# Patient Record
Sex: Female | Born: 1975 | Race: White | Hispanic: No | Marital: Married | Smoking: Never smoker
Health system: Southern US, Community
[De-identification: ages and names within clinical notes are randomized; demographics above are authoritative.]

## PROBLEM LIST (undated history)

## (undated) DIAGNOSIS — N809 Endometriosis, unspecified: Secondary | ICD-10-CM

## (undated) DIAGNOSIS — D259 Leiomyoma of uterus, unspecified: Secondary | ICD-10-CM

## (undated) DIAGNOSIS — F419 Anxiety disorder, unspecified: Secondary | ICD-10-CM

## (undated) DIAGNOSIS — IMO0002 Reserved for concepts with insufficient information to code with codable children: Secondary | ICD-10-CM

## (undated) DIAGNOSIS — J302 Other seasonal allergic rhinitis: Secondary | ICD-10-CM

## (undated) DIAGNOSIS — T7840XA Allergy, unspecified, initial encounter: Secondary | ICD-10-CM

## (undated) HISTORY — DX: Other seasonal allergic rhinitis: J30.2

## (undated) HISTORY — PX: LAPAROSCOPIC ABDOMINAL EXPLORATION: SHX6249

## (undated) HISTORY — DX: Endometriosis, unspecified: N80.9

## (undated) HISTORY — PX: WISDOM TOOTH EXTRACTION: SHX21

## (undated) HISTORY — DX: Reserved for concepts with insufficient information to code with codable children: IMO0002

## (undated) HISTORY — PX: COLPOSCOPY: SHX161

## (undated) HISTORY — DX: Allergy, unspecified, initial encounter: T78.40XA

## (undated) HISTORY — DX: Anxiety disorder, unspecified: F41.9

---
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
## 1999-05-25 ENCOUNTER — Other Ambulatory Visit: Admission: RE | Admit: 1999-05-25 | Discharge: 1999-05-25 | Payer: Self-pay | Admitting: Obstetrics and Gynecology

## 2001-08-25 ENCOUNTER — Ambulatory Visit (HOSPITAL_COMMUNITY): Admission: RE | Admit: 2001-08-25 | Discharge: 2001-08-25 | Payer: Self-pay | Admitting: Obstetrics and Gynecology

## 2001-08-25 ENCOUNTER — Encounter: Payer: Self-pay | Admitting: Obstetrics and Gynecology

## 2001-10-12 ENCOUNTER — Observation Stay (HOSPITAL_COMMUNITY): Admission: RE | Admit: 2001-10-12 | Discharge: 2001-10-13 | Payer: Self-pay | Admitting: Obstetrics and Gynecology

## 2001-12-07 ENCOUNTER — Ambulatory Visit (HOSPITAL_COMMUNITY): Admission: RE | Admit: 2001-12-07 | Discharge: 2001-12-07 | Payer: Self-pay | Admitting: Obstetrics and Gynecology

## 2001-12-07 ENCOUNTER — Encounter: Payer: Self-pay | Admitting: Obstetrics and Gynecology

## 2002-08-29 ENCOUNTER — Other Ambulatory Visit: Admission: RE | Admit: 2002-08-29 | Discharge: 2002-08-29 | Payer: Self-pay | Admitting: Obstetrics and Gynecology

## 2003-09-02 ENCOUNTER — Other Ambulatory Visit: Admission: RE | Admit: 2003-09-02 | Discharge: 2003-09-02 | Payer: Self-pay | Admitting: Obstetrics and Gynecology

## 2004-09-17 ENCOUNTER — Other Ambulatory Visit: Admission: RE | Admit: 2004-09-17 | Discharge: 2004-09-17 | Payer: Self-pay | Admitting: Obstetrics and Gynecology

## 2005-09-09 ENCOUNTER — Other Ambulatory Visit: Admission: RE | Admit: 2005-09-09 | Discharge: 2005-09-09 | Payer: Self-pay | Admitting: Obstetrics and Gynecology

## 2007-04-05 ENCOUNTER — Ambulatory Visit (HOSPITAL_COMMUNITY): Admission: RE | Admit: 2007-04-05 | Discharge: 2007-04-05 | Payer: Self-pay | Admitting: Obstetrics and Gynecology

## 2007-11-30 ENCOUNTER — Ambulatory Visit (HOSPITAL_COMMUNITY): Admission: RE | Admit: 2007-11-30 | Discharge: 2007-11-30 | Payer: Self-pay | Admitting: Obstetrics and Gynecology

## 2009-04-08 IMAGING — RF DG HYSTEROGRAM
5 series · 5 of 5 positions shown · non-contrast
Comparison: none

CLINICAL DATA: Infertility. Evaluate tubal patency.
HYSTEROSALPINGOGRAM:

[Series 1: run · 1 of 1 slices shown (1 of 5)]
[im 1/1]
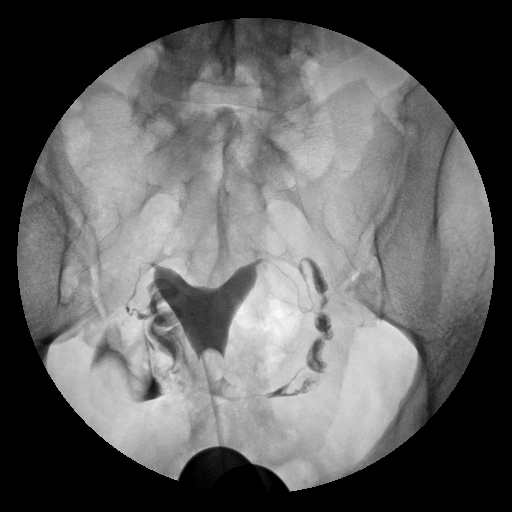

[Series 2: run · 1 of 1 slices shown (2 of 5)]
[im 1/1]
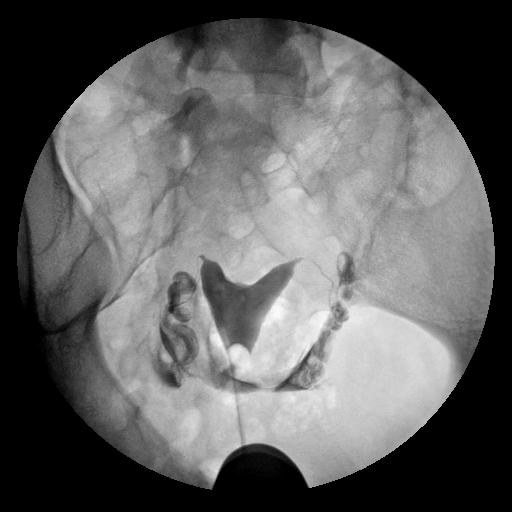

[Series 3: run · 1 of 1 slices shown (3 of 5)]
[im 1/1]
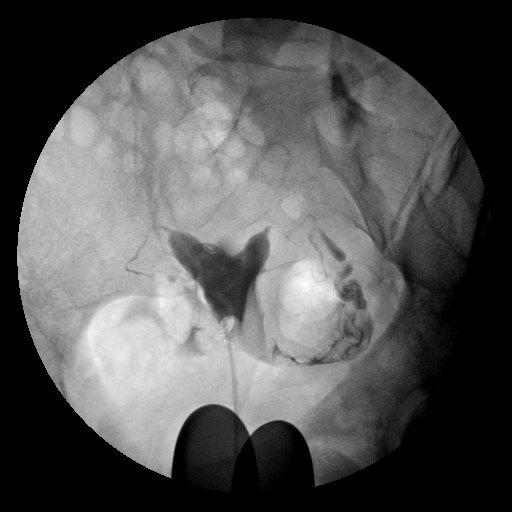

[Series 4: run · 1 of 1 slices shown (4 of 5)]
[im 1/1]
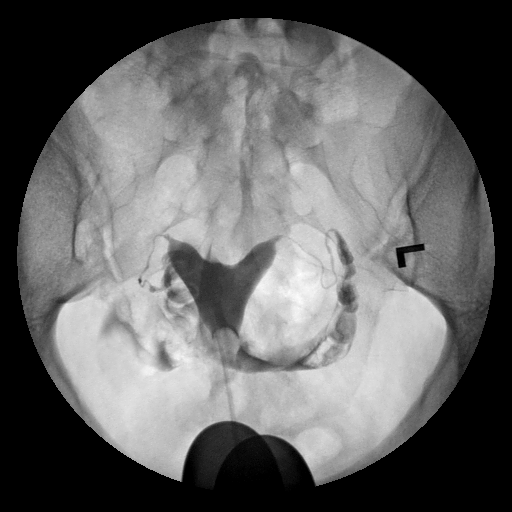

[Series 5: run · 1 of 1 slices shown (5 of 5)]
[im 1/1]
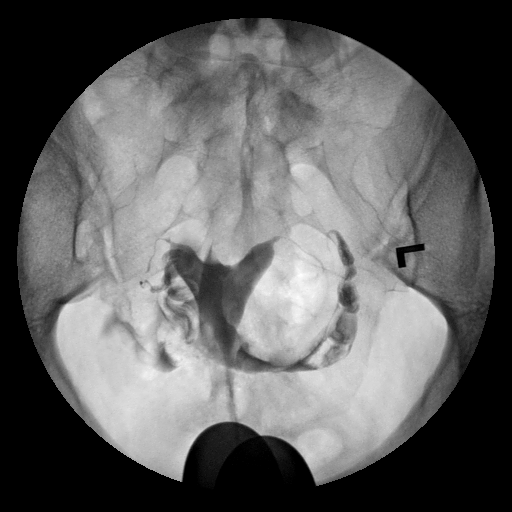

[5 of 5 positions shown; findings below may reference images not displayed]

FINDINGS: Following sterile cleansing of the external cervical os with Betadine, hysterosalpingography was performed via placement of a 5-French hysterosalpingography catheter into the lower uterine segment where it was stabilized with an end-catheter balloon.  Approximately 10 cc of Omnipaque 300% was injected into the endometrial canal without difficulty.   The endometrial morphology is notable for a dip in the fundal contour. This measures less than 1 cm in depth between the line drawn between the cornua and the mid portion of the fundal endometrium suggesting that this is an arcuate morphology rather than signs of a shallow septum.  If confirmation is desired, pelvic ultrasound with 3-D reconstruction or MRI can be undertaken to confirm this impression. No evidence of intraluminal filling defects are otherwise seen.
The fallopian tubes have a normal morphology and bilateral free intraperitoneal spill was demonstrated.   No evidence for loculation of contrast is seen within the pelvis to suggest the presence of peritubal or periovarian adhesions. 
At the conclusion of the exam, the end-catheter balloon was lowered for evaluation of the lower uterine segment, which appeared normal.
IMPRESSION: Uterine endometrial morphology suggesting an arcuate uterus.  Please see above report for more detailed discussion. Bilateral tubal patency with free flow confirmed.

## 2009-08-06 ENCOUNTER — Inpatient Hospital Stay (HOSPITAL_COMMUNITY): Admission: AD | Admit: 2009-08-06 | Discharge: 2009-08-06 | Payer: Self-pay | Admitting: Obstetrics and Gynecology

## 2009-11-06 ENCOUNTER — Inpatient Hospital Stay (HOSPITAL_COMMUNITY): Admission: AD | Admit: 2009-11-06 | Discharge: 2009-11-09 | Payer: Self-pay | Admitting: Family Medicine

## 2009-11-07 ENCOUNTER — Encounter (INDEPENDENT_AMBULATORY_CARE_PROVIDER_SITE_OTHER): Payer: Self-pay | Admitting: Obstetrics and Gynecology

## 2009-11-10 ENCOUNTER — Encounter: Admission: RE | Admit: 2009-11-10 | Discharge: 2009-11-20 | Payer: Self-pay | Admitting: Obstetrics and Gynecology

## 2009-12-11 ENCOUNTER — Encounter: Admission: RE | Admit: 2009-12-11 | Discharge: 2010-01-10 | Payer: Self-pay | Admitting: Obstetrics and Gynecology

## 2010-01-11 ENCOUNTER — Encounter: Admission: RE | Admit: 2010-01-11 | Discharge: 2010-02-10 | Payer: Self-pay | Admitting: Obstetrics and Gynecology

## 2010-03-11 ENCOUNTER — Encounter: Admission: RE | Admit: 2010-03-11 | Discharge: 2010-04-10 | Payer: Self-pay | Admitting: Obstetrics and Gynecology

## 2010-04-11 ENCOUNTER — Encounter: Admission: RE | Admit: 2010-04-11 | Discharge: 2010-05-11 | Payer: Self-pay | Admitting: Obstetrics and Gynecology

## 2010-05-12 ENCOUNTER — Encounter
Admission: RE | Admit: 2010-05-12 | Discharge: 2010-06-11 | Payer: Self-pay | Source: Home / Self Care | Admitting: Obstetrics and Gynecology

## 2010-06-12 ENCOUNTER — Encounter: Admission: RE | Admit: 2010-06-12 | Discharge: 2010-07-12 | Payer: Self-pay | Admitting: Obstetrics and Gynecology

## 2010-07-13 ENCOUNTER — Encounter: Admission: RE | Admit: 2010-07-13 | Discharge: 2010-07-17 | Payer: Self-pay | Admitting: Obstetrics and Gynecology

## 2010-11-22 HISTORY — PX: CERVICAL FUSION: SHX112

## 2011-02-22 LAB — CBC
HCT: 39.1 % (ref 36.0–46.0)
Hemoglobin: 10.6 g/dL — ABNORMAL LOW (ref 12.0–15.0)
Hemoglobin: 13.6 g/dL (ref 12.0–15.0)
MCHC: 34.7 g/dL (ref 30.0–36.0)
MCV: 87.8 fL (ref 78.0–100.0)
Platelets: 210 10*3/uL (ref 150–400)
RBC: 3.44 MIL/uL — ABNORMAL LOW (ref 3.87–5.11)
RBC: 4.46 MIL/uL (ref 3.87–5.11)
RDW: 13.1 % (ref 11.5–15.5)
WBC: 20 10*3/uL — ABNORMAL HIGH (ref 4.0–10.5)
WBC: 22.9 10*3/uL — ABNORMAL HIGH (ref 4.0–10.5)

## 2011-02-22 LAB — RH IMMUNE GLOB WKUP(>/=20WKS)(NOT WOMEN'S HOSP): Fetal Screen: NEGATIVE

## 2011-02-22 LAB — RPR: RPR Ser Ql: NONREACTIVE

## 2011-02-26 LAB — RH IMMUNE GLOBULIN WORKUP (NOT WOMEN'S HOSP)

## 2011-03-10 ENCOUNTER — Ambulatory Visit (HOSPITAL_COMMUNITY)
Admission: RE | Admit: 2011-03-10 | Discharge: 2011-03-10 | Disposition: A | Discharge status: Home / Self Care | Payer: Commercial Indemnity | Source: Ambulatory Visit | Attending: Internal Medicine | Admitting: Internal Medicine

## 2011-03-10 ENCOUNTER — Other Ambulatory Visit (HOSPITAL_COMMUNITY): Payer: Self-pay | Admitting: Internal Medicine

## 2011-03-10 DIAGNOSIS — R52 Pain, unspecified: Secondary | ICD-10-CM

## 2011-03-10 DIAGNOSIS — M542 Cervicalgia: Secondary | ICD-10-CM | POA: Insufficient documentation

## 2011-03-10 DIAGNOSIS — M5030 Other cervical disc degeneration, unspecified cervical region: Secondary | ICD-10-CM | POA: Insufficient documentation

## 2011-03-10 DIAGNOSIS — R209 Unspecified disturbances of skin sensation: Secondary | ICD-10-CM | POA: Insufficient documentation

## 2011-04-09 NOTE — H&P (Signed)
NAME:  Paula Garcia, Paula Garcia               ACCOUNT NO.:  [id number]   MEDICAL RECORD NO.:  [id number]          PATIENT TYPE:  AMB   LOCATION:  [facility]                           FACILITY:  [facility]   PHYSICIAN:  Zenaida Niece, M.D.DATE OF BIRTH:  18-Jul-1976   DATE OF ADMISSION:  04/05/2007  DATE OF DISCHARGE:                              HISTORY & PHYSICAL   CHIEF COMPLAINT:  Pelvic pain.   HISTORY OF PRESENT ILLNESS:  This is a 35 year old white female para 0  who has a long history of pelvic pain and endometriosis. She had a  laparoscopy done in November of 2002 which revealed endometriosis with  adhesions of the ovaries on both sides. After that surgery, she was  initially put on Ortho-Cyclen birth control pills. She was then switched  to Alesse birth control pills and her pain remained fairly stable with  some dyspareunia.  She had increasing pain again in 2004 and we  discussed options.  Her insurance would not cover Lupron so she was  continued on birth control pills.  She remained fairly stable with  occasional pelvic pain. At her annual exam in October, 2007 she had  stopped her birth control pill to possibly conceive.  She was having  painful periods as well as occasional pelvic pain.  Her pelvic pain has  increased to where she is requiring Ultracet for her periods. Due to the  fact that she wishes to conceive, all options were discussed and she  wants to proceed with another laparoscopy with possible fulguration of  endometriosis.   PAST MEDICAL HISTORY:  Is negative.   PAST SURGICAL HISTORY:  Significant only for the laparoscopy with  fulguration of endometriosis and lysis of adhesions.   ALLERGIES:  No known drug allergies.   CURRENT MEDICATIONS:  1. Diflucan 150 mg p.r.n.  2. Ultracet p.r.n. pain.   GYN HISTORY:  She does have a history of normal Pap smears and in 2006  her Pap smear was normal and positive for high risk HPV.   SOCIAL HISTORY:  She is married and denies  alcohol, tobacco or drug use.   FAMILY HISTORY:  Maternal grandmother possibly with breast cancer.   REVIEW OF SYSTEMS:  She has normal bowel and bladder function.   PHYSICAL EXAMINATION:  GENERAL:  This is a well-developed white female  in no acute distress. Weight is 165 pounds.  VITAL SIGNS:  She is afebrile with stable vital signs.  NECK:  Supple without lymphadenopathy or thyromegaly.  LUNGS:  Clear to auscultation.  HEART:  Regular rate and rhythm without murmur.  ABDOMEN:  Soft, nontender, nondistended without palpable masses.  EXTREMITIES:  Have no edema and are nontender.  PELVIC EXAM:  External genitalia has no lesions. On speculum exam the  cervix is normal. On bimanual exam she has a small mid planar nontender  uterus and mild adnexal tenderness without masses.   ASSESSMENT:  Recurrent pelvic pain with history of endometriosis.  The  patient does wish to conceive so treatment options have been discussed  with her. Her treatment options are limited due to the fact  that she  wants to conceive. She does want to proceed with laparoscopy and  possible fulguration of endometriosis.  All risks of surgery have been  discussed and she understands.   PLAN:  The plan is to admit the patient on the day of surgery for  diagnostic laparoscopy with possible fulguration of endometriosis or  lysis of adhesions.      Zenaida Niece, M.D.  Electronically Signed     TDM/MEDQ  D:  04/04/2007  T:  04/04/2007  Job:  [id number]

## 2011-04-09 NOTE — Op Note (Signed)
NAME:  Paula Garcia, Paula Garcia               ACCOUNT NO.:  [id number]   MEDICAL RECORD NO.:  [id number]          PATIENT TYPE:  AMB   LOCATION:  [facility]                           FACILITY:  [facility]   PHYSICIAN:  Zenaida Niece, M.D.DATE OF BIRTH:  [birth year]   DATE OF PROCEDURE:  04/05/2007  DATE OF DISCHARGE:                               OPERATIVE REPORT   PREOPERATIVE DIAGNOSIS:  Pelvic pain and history of endometriosis.   POSTOPERATIVE DIAGNOSIS:  Pelvic pain and endometriosis.   PROCEDURES:  Laparoscopy with fulguration of endometriosis.   SURGEON:  Zenaida Niece, M.D.   ANESTHESIA:  General endotracheal tube.   SPECIMENS:  None.   ESTIMATED BLOOD LOSS:  Minimal.   COMPLICATIONS:  None.   FINDINGS:  She had a normal appendix, liver edge, and gallbladder and  upper abdomen.  The right tube and ovary appeared normal.  There was a  small adhesion of the ovary to the right pelvic sidewall.  Both  uterosacral ligaments and the posterior cul-de-sac had moderate  endometriosis.  The left tube and ovary were essentially normal with a  small amount of endometriosis towards the distal end of the left  fallopian tube.  A small amount of endometriosis in the left ovarian  fossa.  There was also a small spot of endometriosis in the left  anterior cul-de-sac and the uterus was slightly enlarged with several  small fibroids.   PROCEDURE IN DETAIL:  The patient was taken to the operating room and  placed in the dorsosupine position.  General anesthesia was induced and  she was placed in mobile stirrups.  Abdomen was then prepped and draped  in the usual sterile fashion, bladder drained with a latex free  catheter, Hulka tenaculum applied to the cervix for uterine  manipulation.  Infraumbilical skin was infiltrated with quarter percent  Marcaine and a three-quarter centimeter horizontal incision was made in  her previous scar.  The Veress needle was inserted into the peritoneal  cavity and  placement confirmed by the water drop test and opening  pressure 6 mmHg.  CO2 gas was insufflated to a pressure of 14 mmHg and  the Veress needle was removed.  A 5 mm trocar was then introduced with  direct visualization with the laparoscope.  An additional 5 mm port was  then placed on the left side also under direct visualization.  Inspection revealed the above-mentioned findings.  The small adhesion on  the right ovary to the right pelvic sidewall was taken down bluntly and  bleeding from this area was controlled with bipolar cautery.  All  visible areas of endometriosis were fulgurated with bipolar cautery.  The ureters appeared to be lateral to any areas of fulguration.  Some  filmy adhesions of the left ovary to the left pelvic sidewall were taken  down bluntly.  The endometriosis at the distal end of the left fallopian  tube was also fulgurated with bipolar cautery.  The pelvis was irrigated  and found to be hemostatic.  No further areas of endometriosis were  identified except for the small area in the left anterior  cul-de-sac  which was also fulgurated with bipolar cautery.  The uterus was slightly  enlarged with several small fibroids.  The tubes and ovaries otherwise  appeared normal.  The trocar on the left side was removed under direct  visualization.  All gas was allowed to deflate from the abdomen and the  umbilical trocar was then removed.  Skin incisions were closed with  interrupted subcuticular sutures of 4-0 Vicryl followed by Dermabond.  The Hulka tenaculum was then removed.  The patient was awakened in the  operating room and taken to the recovery room in stable condition after  tolerating the procedure well.  Counts were correct and she had PAS hose  on throughout the procedure.      Zenaida Niece, M.D.  Electronically Signed     TDM/MEDQ  D:  04/05/2007  T:  04/05/2007  Job:  [id number]

## 2011-05-05 ENCOUNTER — Ambulatory Visit (HOSPITAL_COMMUNITY)
Admission: RE | Admit: 2011-05-05 | Payer: Commercial Indemnity | Source: Ambulatory Visit | Admitting: Orthopedic Surgery

## 2011-12-16 ENCOUNTER — Other Ambulatory Visit (HOSPITAL_COMMUNITY)
Admission: RE | Admit: 2011-12-16 | Discharge: 2011-12-16 | Disposition: A | Discharge status: Home / Self Care | Payer: Commercial Indemnity | Source: Ambulatory Visit | Attending: Internal Medicine | Admitting: Internal Medicine

## 2011-12-16 ENCOUNTER — Other Ambulatory Visit: Payer: Self-pay | Admitting: Emergency Medicine

## 2011-12-16 DIAGNOSIS — Z01419 Encounter for gynecological examination (general) (routine) without abnormal findings: Secondary | ICD-10-CM | POA: Insufficient documentation

## 2013-12-23 DIAGNOSIS — M5136 Other intervertebral disc degeneration, lumbar region: Secondary | ICD-10-CM | POA: Insufficient documentation

## 2013-12-23 DIAGNOSIS — Z9109 Other allergy status, other than to drugs and biological substances: Secondary | ICD-10-CM | POA: Insufficient documentation

## 2013-12-23 DIAGNOSIS — T7840XA Allergy, unspecified, initial encounter: Secondary | ICD-10-CM

## 2013-12-27 ENCOUNTER — Encounter: Payer: Self-pay | Admitting: Physician Assistant

## 2014-01-24 ENCOUNTER — Encounter: Payer: Self-pay | Admitting: Physician Assistant

## 2014-01-24 ENCOUNTER — Ambulatory Visit (INDEPENDENT_AMBULATORY_CARE_PROVIDER_SITE_OTHER): Payer: Commercial Indemnity | Admitting: Physician Assistant

## 2014-01-24 VITALS — BP 102/62 | HR 72 | Temp 97.7°F | Resp 16 | Ht 65.0 in | Wt 198.0 lb

## 2014-01-24 DIAGNOSIS — E559 Vitamin D deficiency, unspecified: Secondary | ICD-10-CM

## 2014-01-24 DIAGNOSIS — Z0000 Encounter for general adult medical examination without abnormal findings: Secondary | ICD-10-CM

## 2014-01-24 LAB — CBC WITH DIFFERENTIAL/PLATELET
Basophils Absolute: 0.1 10*3/uL (ref 0.0–0.1)
Basophils Relative: 1 % (ref 0–1)
Eosinophils Absolute: 0.2 10*3/uL (ref 0.0–0.7)
Eosinophils Relative: 3 % (ref 0–5)
HCT: 39.5 % (ref 36.0–46.0)
HEMOGLOBIN: 14 g/dL (ref 12.0–15.0)
Lymphocytes Relative: 26 % (ref 12–46)
Lymphs Abs: 1.8 10*3/uL (ref 0.7–4.0)
MCH: 28.8 pg (ref 26.0–34.0)
MCHC: 35.4 g/dL (ref 30.0–36.0)
MCV: 81.3 fL (ref 78.0–100.0)
MONOS PCT: 12 % (ref 3–12)
Monocytes Absolute: 0.8 10*3/uL (ref 0.1–1.0)
NEUTROS PCT: 58 % (ref 43–77)
Neutro Abs: 4.1 10*3/uL (ref 1.7–7.7)
PLATELETS: 285 10*3/uL (ref 150–400)
RBC: 4.86 MIL/uL (ref 3.87–5.11)
RDW: 14.3 % (ref 11.5–15.5)
WBC: 7 10*3/uL (ref 4.0–10.5)

## 2014-01-24 LAB — HEMOGLOBIN A1C
HEMOGLOBIN A1C: 5.1 % (ref <5.7)
Mean Plasma Glucose: 100 mg/dL (ref <117)

## 2014-01-24 NOTE — Patient Instructions (Signed)
   Bad carbs also include fruit juice, alcohol, and sweet tea. These are empty calories that do not signal to your brain that you are full.   Please remember the good carbs are still carbs which convert into sugar. So please measure them out no more than 1/2-1 cup of rice, oatmeal, pasta, and beans.  Veggies are however free foods! Pile them on.   I like lean protein at every meal such as chicken, [place], pork chops, cottage cheese, etc. Just do not fry these meats and please center your meal around vegetable, the meats should be a side dish.   No all fruit is created equal. Please see the list below, the fruit at the bottom is higher in sugars than the fruit at the top   We want weight loss that will last so you should lose 1-2 pounds a week.  THAT IS IT! Please pick THREE things a month to change. Once it is a habit check off the item. Then pick another three items off the list to become habits.  If you are already doing a habit on the list GREAT!  Cross that item off! o Don't drink your calories. Ie, alcohol, soda, fruit juice, and sweet tea.  o Drink more water. Drink a glass when you feel hungry or before each meal.  o Eat breakfast - Complex carb and protein (likeDannon light and fit yogurt, oatmeal, fruit, eggs, [place] bacon). o Measure your cereal.  Eat no more than one cup a day. (ie [place]) o Eat an apple a day. o Add a vegetable a day. o Try a new vegetable a month. o Use Pam! Stop using oil or butter to cook. o Don't finish your plate or use smaller plates. o Share your dessert. o Eat sugar free Jello for dessert or frozen grapes. o Don't eat 2-3 hours before bed. o Switch to whole wheat bread, pasta, and brown rice. o Make healthier choices when you eat out. No fries! o Pick baked chicken, NOT fried. o Don't forget to SLOW DOWN when you eat. It is not going anywhere.  o Take the stairs. o Park far away in the parking lot o [employer] (or weights) for 10 minutes while  watching TV. o Walk at work for 10 minutes during break. o Walk outside 1 time a week with your friend, kids, dog, or significant other. o Start a walking group at [place] the mall as much as you can tolerate.  o Keep a food diary. o Weigh yourself daily. o Walk for 15 minutes 3 days per week. o Cook at home more often and eat out less.  If life happens and you go back to old habits, it is okay.  Just start over. You can do it!   If you experience chest pain, get [place] of breath, or tired during the exercise, please stop immediately and inform your doctor.

## 2014-01-24 NOTE — Progress Notes (Signed)
Complete Physical HPI 38 y.o. female  presents for a complete physical. Her blood pressure has been controlled at home, today their BP is BP: 102/62 mmHg She is working out, was doing couch to 5 K but the weather has been decreasing her exercise routine. No CP, SOB.   Current Medications:  No current outpatient prescriptions on file prior to visit.   No current facility-administered medications on file prior to visit.   Health Maintenance:  Immunization History  Administered Date(s) Administered  . Tdap 10/07/2011   Tetanus: 2012 Pneumovax: N/A Flu vaccine: 08/2013 at work Zostavax: N/A Pap: 2013 normal, never had abnormal pap, due 2016 MGM: 10/2012 negative due to dense breast DEXA: N/A Colonoscopy: N/A EGD: N/A  Allergies: No Known Allergies Medical History:  Past Medical History  Diagnosis Date  . Allergy   . DDD (degenerative disc disease)   . Endometriosis    Surgical History:  Past Surgical History  Procedure Laterality Date  . Cervical fusion      C3-C4  C5-C6  . Colposcopy      age 34  . Cesarean section    . Wisdom tooth extraction    . Laparoscopic abdominal exploration  [phone]    due to endometriosis   Family History:  Family History  Problem Relation Age of Onset  . Cancer Mother     Skin  . Heart disease Father   . Cancer Maternal Aunt     throat   Social History:  History  Substance Use Topics  . Smoking status: Never Smoker   . Smokeless tobacco: Never Used  . Alcohol Use: Yes     Comment: Rare/social     Review of Systems: [X]  = complains of  [ ]  = denies  General: Fatigue [ ]  Fever [ ]  Chills [ ]  Weakness [ ]   Insomnia [ ]   Weight change [ ]  Night sweats [ ]   Change in appetite [ ]  Eyes: Redness [ ]  Blurred vision [ ]  Diplopia [ ]  Discharge [ ]   ENT: Congestion left nostril Valu.Nieves ] Sinus Pain [ ]  Post Nasal Drip [ ]  Sore Throat [ ]  Earache [ ]  hearing loss [ ]  Tinnitus [ ]  Snoring occ per husband, sleep well Valu.Nieves ]  Cardiac: Chest  pain/pressure [ ]  SOB [ ]  Orthopnea [ ]   Palpitations [ ]   Paroxysmal nocturnal dyspnea[ ]  Claudication [ ]  Edema [ ]   Pulmonary: Cough [ ]  Wheezing[ ]   SOB [ ]   Pleurisy [ ]   GI: Nausea [ ]  Vomiting[ ]  Dysphagia[ ]  Heartburn[ ]  Abdominal pain [ ]  Constipation [ ] ; Diarrhea [ ]  BRBPR [ ]  Melena[ ]  Bloating [ ]  Hemorrhoids [ ]   GU: Hematuria[ ]  Dysuria [ ]  Nocturia[ ]  Urgency [ ]   Hesitancy [ ]  Discharge [ ]  Frequency [ ]   Breast:  Breast lumps [ ]   nipple discharge [ ]    Neuro: Headaches occipital Valu.Nieves ] Vertigo[ ]  Paresthesias[ ]  Spasm [ ]  Speech changes [ ]  Incoordination [ ]   Ortho: Arthritis [ ]  Joint pain [ ]  Muscle pain [ ]  Joint swelling [ ]  Back Pain [ ]  Skin:  Rash [ ]   Pruritis [ ]  Change in skin lesion [ ]   Psych: Depression[ ]  Anxiety[ ]  Confusion [ ]  Memory loss [ ]   Heme/Lypmh: Bleeding [ ]  Bruising [ ]  Enlarged lymph nodes [ ]   Endocrine: Visual blurring [ ]  Paresthesia [ ]  Polyuria [ ]  Polydypsea [ ]    Heat/cold intolerance [ ]  Hypoglycemia [ ]   Physical Exam: Estimated body mass index is 32.95 kg/(m^2) as calculated from the following:   Height as of this encounter: 5\' 5"  (1.651 m).   Weight as of this encounter: 198 lb (89.812 kg). Filed Vitals:   01/24/14 0908  BP: 102/62  Pulse: 72  Temp: 97.7 F (36.5 C)  Resp: 16   General Appearance: Well nourished, in no apparent distress. Eyes: PERRLA, EOMs, conjunctiva no swelling or erythema, normal fundi and vessels. Sinuses: No Frontal/maxillary tenderness ENT/Mouth: Ext aud canals clear, normal light reflex with TMs without erythema, bulging.  Good dentition. No erythema, swelling, or exudate on post pharynx. Tonsils not swollen or erythematous. Hearing normal.  Neck: Supple, thyroid normal. No bruits Respiratory: Respiratory effort normal, BS equal bilaterally without rales, rhonchi, wheezing or stridor. Cardio: RRR without murmurs, rubs or gallops. Brisk peripheral pulses without edema.  Chest: symmetric, with normal  excursions and percussion. Breasts: Symmetric, without lumps, nipple discharge, retractions. Abdomen: Soft, +BS. Non tender, no guarding, rebound, hernias, masses, or organomegaly. .  Lymphatics: Non tender without lymphadenopathy.  Genitourinary: defer Musculoskeletal: Full ROM all peripheral extremities,5/5 strength, and normal gait. Skin: Warm, dry without rashes, lesions, ecchymosis. Right lower flank- 2x18mm very dark reg borders, upper center back 4x55mm brown irreg border nevus.  Neuro: Cranial nerves intact, reflexes equal bilaterally. Normal muscle tone, no cerebellar symptoms. Sensation intact.  Psych: Awake and oriented X 3, normal affect, Insight and Judgment appropriate.   EKG: WNL no changes.  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  Depression screening negative  Health Maintenance  Discussed med's effects and SE's. Screening labs and tests as requested with regular follow-up as recommended.   Vicie Mutters 9:25 AM

## 2014-01-25 LAB — HEPATIC FUNCTION PANEL
ALBUMIN: 4.8 g/dL (ref 3.5–5.2)
ALK PHOS: 72 U/L (ref 39–117)
ALT: 17 U/L (ref 0–35)
AST: 17 U/L (ref 0–37)
BILIRUBIN INDIRECT: 1 mg/dL (ref 0.2–1.2)
BILIRUBIN TOTAL: 1.2 mg/dL (ref 0.2–1.2)
Bilirubin, Direct: 0.2 mg/dL (ref 0.0–0.3)
Total Protein: 7.6 g/dL (ref 6.0–8.3)

## 2014-01-25 LAB — FERRITIN: FERRITIN: 17 ng/mL (ref 10–291)

## 2014-01-25 LAB — LIPID PANEL
CHOL/HDL RATIO: 3 ratio
Cholesterol: 157 mg/dL (ref 0–200)
HDL: 52 mg/dL (ref >39)
LDL CALC: 68 mg/dL (ref 0–99)
Triglycerides: 185 mg/dL — ABNORMAL HIGH (ref <150)
VLDL: 37 mg/dL (ref 0–40)

## 2014-01-25 LAB — BASIC METABOLIC PANEL WITH GFR
BUN: 12 mg/dL (ref 6–23)
CHLORIDE: 101 meq/L (ref 96–112)
CO2: 28 meq/L (ref 19–32)
CREATININE: 0.66 mg/dL (ref 0.50–1.10)
Calcium: 10.1 mg/dL (ref 8.4–10.5)
GFR, Est African American: >89 mL/min
Glucose, Bld: 94 mg/dL (ref 70–99)
POTASSIUM: 4.4 meq/L (ref 3.5–5.3)
Sodium: 137 mEq/L (ref 135–145)

## 2014-01-25 LAB — INSULIN, FASTING: Insulin fasting, serum: 7 u[IU]/mL (ref 3–28)

## 2014-01-25 LAB — VITAMIN B12: VITAMIN B 12: 673 pg/mL (ref 211–911)

## 2014-01-25 LAB — IRON AND TIBC
%SAT: 33 % (ref 20–55)
IRON: 119 ug/dL (ref 42–145)
TIBC: 356 ug/dL (ref 250–470)
UIBC: 237 ug/dL (ref 125–400)

## 2014-01-25 LAB — URINALYSIS, ROUTINE W REFLEX MICROSCOPIC
BILIRUBIN URINE: NEGATIVE
GLUCOSE, UA: NEGATIVE mg/dL
HGB URINE DIPSTICK: NEGATIVE
KETONES UR: NEGATIVE mg/dL
Leukocytes, UA: NEGATIVE
NITRITE: NEGATIVE
PH: 6.5 (ref 5.0–8.0)
Protein, ur: NEGATIVE mg/dL
SPECIFIC GRAVITY, URINE: 1.009 (ref 1.005–1.030)
Urobilinogen, UA: 0.2 mg/dL (ref 0.0–1.0)

## 2014-01-25 LAB — VITAMIN D 25 HYDROXY (VIT D DEFICIENCY, FRACTURES): VIT D 25 HYDROXY: 72 ng/mL (ref 30–89)

## 2014-01-25 LAB — MICROALBUMIN / CREATININE URINE RATIO
CREATININE, URINE: 39.7 mg/dL
MICROALB/CREAT RATIO: 12.6 mg/g (ref 0.0–30.0)
Microalb, Ur: 0.5 mg/dL (ref 0.00–1.89)

## 2014-01-25 LAB — TSH: TSH: 2.986 u[IU]/mL (ref 0.350–4.500)

## 2014-01-25 LAB — MAGNESIUM: MAGNESIUM: 2 mg/dL (ref 1.5–2.5)

## 2015-01-17 ENCOUNTER — Emergency Department (HOSPITAL_COMMUNITY)
Admission: EM | Admit: 2015-01-17 | Discharge: 2015-01-17 | Disposition: A | Discharge status: Home / Self Care | Payer: Managed Care, Other (non HMO) | Attending: Emergency Medicine | Admitting: Emergency Medicine

## 2015-01-17 ENCOUNTER — Encounter (HOSPITAL_COMMUNITY): Payer: Self-pay | Admitting: Emergency Medicine

## 2015-01-17 DIAGNOSIS — Z79899 Other long term (current) drug therapy: Secondary | ICD-10-CM | POA: Insufficient documentation

## 2015-01-17 DIAGNOSIS — Z3202 Encounter for pregnancy test, result negative: Secondary | ICD-10-CM | POA: Insufficient documentation

## 2015-01-17 DIAGNOSIS — R109 Unspecified abdominal pain: Secondary | ICD-10-CM | POA: Diagnosis present

## 2015-01-17 DIAGNOSIS — N946 Dysmenorrhea, unspecified: Secondary | ICD-10-CM | POA: Insufficient documentation

## 2015-01-17 DIAGNOSIS — Z7982 Long term (current) use of aspirin: Secondary | ICD-10-CM | POA: Diagnosis not present

## 2015-01-17 DIAGNOSIS — Z8739 Personal history of other diseases of the musculoskeletal system and connective tissue: Secondary | ICD-10-CM | POA: Diagnosis not present

## 2015-01-17 DIAGNOSIS — R1030 Lower abdominal pain, unspecified: Secondary | ICD-10-CM

## 2015-01-17 LAB — URINALYSIS, ROUTINE W REFLEX MICROSCOPIC
Bilirubin Urine: NEGATIVE
GLUCOSE, UA: NEGATIVE mg/dL
Hgb urine dipstick: NEGATIVE
Ketones, ur: NEGATIVE mg/dL
Leukocytes, UA: NEGATIVE
NITRITE: NEGATIVE
PH: 6 (ref 5.0–8.0)
Protein, ur: NEGATIVE mg/dL
SPECIFIC GRAVITY, URINE: 1.014 (ref 1.005–1.030)
Urobilinogen, UA: 0.2 mg/dL (ref 0.0–1.0)

## 2015-01-17 LAB — COMPREHENSIVE METABOLIC PANEL
ALT: 23 U/L (ref 0–35)
ANION GAP: 7 (ref 5–15)
AST: 20 U/L (ref 0–37)
Albumin: 4.2 g/dL (ref 3.5–5.2)
Alkaline Phosphatase: 71 U/L (ref 39–117)
BUN: 14 mg/dL (ref 6–23)
CALCIUM: 8.9 mg/dL (ref 8.4–10.5)
CO2: 25 mmol/L (ref 19–32)
CREATININE: 0.73 mg/dL (ref 0.50–1.10)
Chloride: 99 mmol/L (ref 96–112)
GLUCOSE: 123 mg/dL — AB (ref 70–99)
Potassium: 3.7 mmol/L (ref 3.5–5.1)
Sodium: 131 mmol/L — ABNORMAL LOW (ref 135–145)
Total Bilirubin: 1 mg/dL (ref 0.3–1.2)
Total Protein: 7.2 g/dL (ref 6.0–8.3)

## 2015-01-17 LAB — CBC WITH DIFFERENTIAL/PLATELET
BASOS ABS: 0 10*3/uL (ref 0.0–0.1)
Basophils Relative: 0 % (ref 0–1)
Eosinophils Absolute: 0.3 10*3/uL (ref 0.0–0.7)
Eosinophils Relative: 3 % (ref 0–5)
HEMATOCRIT: 37.9 % (ref 36.0–46.0)
HEMOGLOBIN: 13 g/dL (ref 12.0–15.0)
Lymphocytes Relative: 23 % (ref 12–46)
Lymphs Abs: 2 10*3/uL (ref 0.7–4.0)
MCH: 28.5 pg (ref 26.0–34.0)
MCHC: 34.3 g/dL (ref 30.0–36.0)
MCV: 83.1 fL (ref 78.0–100.0)
MONO ABS: 0.6 10*3/uL (ref 0.1–1.0)
MONOS PCT: 7 % (ref 3–12)
NEUTROS ABS: 6 10*3/uL (ref 1.7–7.7)
Neutrophils Relative %: 67 % (ref 43–77)
Platelets: 228 10*3/uL (ref 150–400)
RBC: 4.56 MIL/uL (ref 3.87–5.11)
RDW: 12.9 % (ref 11.5–15.5)
WBC: 8.9 10*3/uL (ref 4.0–10.5)

## 2015-01-17 LAB — POC URINE PREG, ED: Preg Test, Ur: NEGATIVE

## 2015-01-17 NOTE — Discharge Instructions (Signed)

## 2015-01-17 NOTE — ED Notes (Signed)
PA at bedside.

## 2015-01-17 NOTE — ED Provider Notes (Signed)
CSN: [id number]     Arrival date & time 01/17/15  0222 History   First MD Initiated Contact with Patient 01/17/15 0425     Chief Complaint  Patient presents with  . Abdominal Pain    (Consider location/radiation/quality/duration/timing/severity/associated sxs/prior Treatment) HPI Comments: Patient is a 39 year old female with a history of cesarean section and endometriosis who presents to the emergency department for worsening suprapubic discomfort. Patient describes her pain as cramping in nature and states it has been constant and waxing and waning in severity. Patient states her menstrual cycle began 2 days ago. She has noted worsening abdominal pain since onset of her menses. She believes her symptoms feel consistent with pain associated with her endometriosis. Pain worsened this evening causing nausea as well as one episode of emesis. Patient denies associated fever, syncope, chest pain, shortness of breath, hematemesis, diarrhea, melena or hematochezia, dysuria, or hematuria. She denies taking any medications for her symptoms prior to arrival.  Patient is a 39 y.o. female presenting with abdominal pain. The history is provided by the patient. No language interpreter was used.  Abdominal Pain Associated symptoms: nausea and vomiting   Associated symptoms: no diarrhea, no dysuria, no fever and no hematuria     Past Medical History  Diagnosis Date  . Allergy   . DDD (degenerative disc disease)   . Endometriosis    Past Surgical History  Procedure Laterality Date  . Cervical fusion      C3-C4  C5-C6  . Colposcopy      age 7  . Cesarean section    . Wisdom tooth extraction    . Laparoscopic abdominal exploration  [phone]    due to endometriosis   Family History  Problem Relation Age of Onset  . Cancer Mother     Skin  . Heart disease Father   . Cancer Maternal Aunt     throat   History  Substance Use Topics  . Smoking status: Never Smoker   . Smokeless tobacco: Never  Used  . Alcohol Use: Yes     Comment: Rare/social   OB History    No data available      Review of Systems  Constitutional: Negative for fever.  Gastrointestinal: Positive for nausea, vomiting and abdominal pain. Negative for diarrhea.  Genitourinary: Positive for pelvic pain. Negative for dysuria and hematuria.  All other systems reviewed and are negative.   Allergies  Review of patient's allergies indicates no known allergies.  Home Medications   Prior to Admission medications   Medication Sig Start Date End Date Taking? Authorizing Provider  aspirin 81 MG tablet Take 81 mg by mouth daily.   Yes Historical Provider, MD  cholecalciferol (VITAMIN D) 1000 UNITS tablet Take 1,000 Units by mouth daily.   Yes Historical Provider, MD  ferrous sulfate 325 (65 FE) MG tablet Take 325 mg by mouth daily with breakfast.   Yes Historical Provider, MD  Multiple Vitamin (MULTIVITAMIN WITH MINERALS) TABS tablet Take 1 tablet by mouth daily.   Yes Historical Provider, MD   BP 124/72 mmHg  Pulse 83  Temp(Src) 98.1 F (36.7 C) (Oral)  Resp 16  SpO2 98%  LMP 12/17/2014 (Approximate)   Physical Exam  Constitutional: She is oriented to person, place, and time. She appears well-developed and well-nourished. No distress.  Nontoxic/nonseptic appearing  HENT:  Head: Normocephalic and atraumatic.  Eyes: Conjunctivae and EOM are normal. No scleral icterus.  Neck: Normal range of motion.  Cardiovascular: Normal rate, regular rhythm and  intact distal pulses.   Pulmonary/Chest: Effort normal and breath sounds normal. No respiratory distress. She has no wheezes. She has no rales.  Respirations even and unlabored  Abdominal: Soft. She exhibits no distension. There is tenderness. There is no rebound.  Mild suprapubic tenderness on deep palpation. Abdomen soft. No peritoneal signs.  Musculoskeletal: Normal range of motion.  Neurological: She is alert and oriented to person, place, and time. She  exhibits normal muscle tone. Coordination normal.  Skin: Skin is warm and dry. No rash noted. She is not diaphoretic. No erythema. No pallor.  Psychiatric: She has a normal mood and affect. Her behavior is normal.  Nursing note and vitals reviewed.   ED Course  Procedures (including critical care time) Labs Review Labs Reviewed  COMPREHENSIVE METABOLIC PANEL - Abnormal; Notable for the following:    Sodium 131 (*)    Glucose, Bld 123 (*)    All other components within normal limits  CBC WITH DIFFERENTIAL/PLATELET  URINALYSIS, ROUTINE W REFLEX MICROSCOPIC  POC URINE PREG, ED    Imaging Review No results found.   EKG Interpretation None      MDM   Final diagnoses:  Lower abdominal pain  Dysmenorrhea    39 year old female presents to the emergency department for worsening lower abdominal pain associated with onset of her menstrual cycle. She does have a history of endometriosis and feels as though this pain is similar. On my encounter, patient has mild tenderness on deep palpation of her suprapubic region. Her abdomen is soft and without peritoneal signs. She has no tenderness at McBurney's point. Doubt appendicitis given abdominal examination and lack of leukocytosis or fever. Doubt ovarian torsion. Upreg negative ruling out ectopic pregnancy. No evidence of UTI.   Patient has been pending a room on the acute side of the ED since arrival. She reports improvement in her pain without intervention and is now desiring discharge. I believe the patient is stable for discharge with outpatient follow-up with her OB/GYN. Have advised NSAIDs for pain; patient declines pain medication in ED. Return precautions given and patient agreeable to plan with no unaddressed concerns.   Filed Vitals:   01/17/15 0227 01/17/15 0430  BP: 125/90 124/72  Pulse: 85 83  Temp: 98.1 F (36.7 C) 98.1 F (36.7 C)  TempSrc: Oral Oral  Resp: 18 16  SpO2: 98% 98%     Antonietta Breach, PA-C 01/17/15  [id number]  Wynetta Fines, MD 01/17/15 [phone]

## 2015-01-17 NOTE — ED Notes (Signed)
Pt is c/o abd pain that started a couple days ago and has progressively gotten worse  Pt states tonight she has nausea without vomiting

## 2015-01-27 ENCOUNTER — Ambulatory Visit (INDEPENDENT_AMBULATORY_CARE_PROVIDER_SITE_OTHER): Payer: Commercial Indemnity | Admitting: Physician Assistant

## 2015-01-27 ENCOUNTER — Encounter: Payer: Self-pay | Admitting: Physician Assistant

## 2015-01-27 VITALS — BP 102/62 | HR 76 | Temp 97.7°F | Resp 16 | Wt 196.0 lb

## 2015-01-27 DIAGNOSIS — D225 Melanocytic nevi of trunk: Secondary | ICD-10-CM

## 2015-01-27 DIAGNOSIS — Z0000 Encounter for general adult medical examination without abnormal findings: Secondary | ICD-10-CM

## 2015-01-27 DIAGNOSIS — D62 Acute posthemorrhagic anemia: Secondary | ICD-10-CM

## 2015-01-27 DIAGNOSIS — T7840XD Allergy, unspecified, subsequent encounter: Secondary | ICD-10-CM

## 2015-01-27 DIAGNOSIS — E559 Vitamin D deficiency, unspecified: Secondary | ICD-10-CM

## 2015-01-27 DIAGNOSIS — E669 Obesity, unspecified: Secondary | ICD-10-CM

## 2015-01-27 DIAGNOSIS — M5136 Other intervertebral disc degeneration, lumbar region: Secondary | ICD-10-CM

## 2015-01-27 DIAGNOSIS — Z79899 Other long term (current) drug therapy: Secondary | ICD-10-CM

## 2015-01-27 LAB — CBC WITH DIFFERENTIAL/PLATELET
BASOS PCT: 1 % (ref 0–1)
Basophils Absolute: 0.1 10*3/uL (ref 0.0–0.1)
EOS ABS: 0.2 10*3/uL (ref 0.0–0.7)
EOS PCT: 2 % (ref 0–5)
HEMATOCRIT: 39.7 % (ref 36.0–46.0)
HEMOGLOBIN: 13.3 g/dL (ref 12.0–15.0)
Lymphocytes Relative: 23 % (ref 12–46)
Lymphs Abs: 1.8 10*3/uL (ref 0.7–4.0)
MCH: 27.9 pg (ref 26.0–34.0)
MCHC: 33.5 g/dL (ref 30.0–36.0)
MCV: 83.4 fL (ref 78.0–100.0)
MONO ABS: 0.6 10*3/uL (ref 0.1–1.0)
MONOS PCT: 8 % (ref 3–12)
MPV: 9.1 fL (ref 8.6–12.4)
Neutro Abs: 5.1 10*3/uL (ref 1.7–7.7)
Neutrophils Relative %: 66 % (ref 43–77)
Platelets: 310 10*3/uL (ref 150–400)
RBC: 4.76 MIL/uL (ref 3.87–5.11)
RDW: 13.4 % (ref 11.5–15.5)
WBC: 7.7 10*3/uL (ref 4.0–10.5)

## 2015-01-27 LAB — HEMOGLOBIN A1C
Hgb A1c MFr Bld: 5.1 % (ref <5.7)
Mean Plasma Glucose: 100 mg/dL (ref <117)

## 2015-01-27 NOTE — Progress Notes (Signed)
Complete Physical  Assessment and Plan: Allergy- OTC  DDD (degenerative disc disease)  Endometriosis- heavy periods, f/u OB/GYN, does not want BCP  Obesity, BMI- discussed weight loss Deviated septum- monitor, nasonex, breath right strips Fair skin, at risk skin cancer- Will schedule mole removal left flank and removal of skin tag left axilla Depression screening negative  Health Maintenance  Discussed med's effects and SE's. Screening labs and tests as requested with regular follow-up as recommended. HPI 39 y.o. female  presents for a complete physical. Her blood pressure has been controlled at home, today their BP is BP: 102/62 mmHg She is not working out but wants to start with a friend , was doing couch to 5 K but the weather has been decreasing her exercise routine. No CP, SOB.  Recent ER visit due to AB pain worse than usual during menses, she has a history of csection and endometriosis, ectopic ruled out and pain got better. She has regular periods but are normally heavy.  She is on 1000 IU daily.  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 She works in [place].   Current Medications:  Current Outpatient Prescriptions on File Prior to Visit  Medication Sig Dispense Refill  . aspirin 81 MG tablet Take 81 mg by mouth daily.    . cholecalciferol (VITAMIN D) 1000 UNITS tablet Take 1,000 Units by mouth daily.    . Multiple Vitamin (MULTIVITAMIN WITH MINERALS) TABS tablet Take 1 tablet by mouth daily.     No current facility-administered medications on file prior to visit.   Health Maintenance:  Immunization History  Administered Date(s) Administered  . Tdap 10/07/2011   Tetanus: 2012 Pneumovax: N/A Flu vaccine: 08/2013 normally at work but did not get this year Zostavax: N/A Pap: 2013 normal, never had abnormal pap, due 2016 will go to OB/GYN MGM: 10/2012 negative due to dense breast due next year/age 89 Patient's last menstrual period was  01/14/2015. DEXA: N/A Colonoscopy: N/A EGD: N/A  Allergies: No Known Allergies Medical History:  Past Medical History  Diagnosis Date  . Allergy   . DDD (degenerative disc disease)   . Endometriosis    Surgical History:  Past Surgical History  Procedure Laterality Date  . Cervical fusion      C3-C4  C5-C6  . Colposcopy      age 86  . Cesarean section    . Wisdom tooth extraction    . Laparoscopic abdominal exploration  [phone]    due to endometriosis   Family History:  Family History  Problem Relation Age of Onset  . Cancer Mother     Skin  . Heart disease Father   . Cancer Maternal Aunt     throat   Social History:  History  Substance Use Topics  . Smoking status: Never Smoker   . Smokeless tobacco: Never Used  . Alcohol Use: Yes     Comment: Rare/social   Review of Systems  Constitutional: Negative.   HENT: Negative.   Eyes: Negative.   Respiratory: Negative.   Cardiovascular: Negative.   Gastrointestinal: Negative.   Genitourinary: Negative.   Musculoskeletal: Negative.   Skin: Negative.   Neurological: Negative.   Endo/Heme/Allergies: Negative.   Psychiatric/Behavioral: Negative.    Physical Exam: Estimated body mass index is 32.62 kg/(m^2) as calculated from the following:   Height as of 01/24/14: 5\' 5"  (1.651 m).   Weight as of this encounter: 196 lb (88.905 kg). Filed Vitals:   01/27/15 0900  BP: 102/62  Pulse: 76  Temp: 97.7 F (36.5 C)  Resp: 16   General Appearance: Well nourished, in no apparent distress. Eyes: PERRLA, EOMs, conjunctiva no swelling or erythema, normal fundi and vessels. Sinuses: No Frontal/maxillary tenderness ENT/Mouth: Ext aud canals clear, normal light reflex with TMs without erythema, bulging.  Good dentition. No erythema, swelling, or exudate on post pharynx. Tonsils not swollen or erythematous. Hearing normal.  Neck: Supple, thyroid normal. No bruits Respiratory: Respiratory effort normal, BS equal bilaterally  without rales, rhonchi, wheezing or stridor. Cardio: RRR without murmurs, rubs or gallops. Brisk peripheral pulses without edema.  Chest: symmetric, with normal excursions and percussion. Breasts: Symmetric, with lumps,without nipple discharge, retractions. Abdomen: Soft, +BS. Non tender, no guarding, rebound, hernias, masses, or organomegaly. .  Lymphatics: Non tender without lymphadenopathy.  Genitourinary: defer Musculoskeletal: Full ROM all peripheral extremities,5/5 strength, and normal gait. Skin: Warm, dry without rashes, lesions, ecchymosis. Left lower flank- 2x84mm very dark reg borders, upper center back 4x9mm brown irreg border nevus.  Neuro: Cranial nerves intact, reflexes equal bilaterally. Normal muscle tone, no cerebellar symptoms. Sensation intact.  Psych: Awake and oriented X 3, normal affect, Insight and Judgment appropriate.   EKG: defer   Vicie Mutters 9:19 AM

## 2015-01-27 NOTE — Patient Instructions (Signed)
Before you even begin to attack a weight-loss plan, it pays to remember this: You are not fat. You have fat. Losing weight isn't about blame or shame; it's simply another achievement to accomplish. Dieting is like any other skill-you have to buckle down and work at it. As long as you act in a smart, reasonable way, you'll ultimately get where you want to be. Here are some weight loss pearls for you.  1. It's Not a Diet. It's a Lifestyle Thinking of a diet as something you're on and suffering through only for the [place] term doesn't work. To shed weight and keep it off, you need to make permanent changes to the way you eat. It's OK to indulge occasionally, of course, but if you cut calories temporarily and then revert to your old way of eating, you'll gain back the weight quicker than you can say yo-yo. Use it to lose it. Research shows that one of the best predictors of long-term weight loss is how many pounds you drop in the first month. For that reason, nutritionists often suggest being stricter for the first two weeks of your new eating strategy to build momentum. Cut out added sugar and alcohol and avoid unrefined carbs. After that, figure out how you can reincorporate them in a way that's healthy and maintainable.  2. There's a Right Way to Exercise Working out burns calories and fat and boosts your metabolism by building muscle. But those trying to lose weight are notorious for overestimating the number of calories they burn and underestimating the amount they take in. Unfortunately, your system is biologically programmed to hold on to extra pounds and that means when you start exercising, your body senses the deficit and ramps up its hunger signals. If you're not diligent, you'll eat everything you burn and then some. Use it to lose it. Cardio gets all the exercise glory, but strength and interval training are the real heroes. They help you build lean muscle, which in turn increases your metabolism and  calorie-burning ability 3. Don't Overreact to Mild Hunger Some people have a hard time losing weight because of hunger anxiety. To them, being hungry is bad-something to be avoided at all costs-so they carry snacks with them and eat when they don't need to. Others eat because they're stressed out or bored. While you never want to get to the point of being ravenous (that's when bingeing is likely to happen), a hunger pang, a craving, or the fact that it's 3:00 p.m. should not send you racing for the vending machine or obsessing about the energy bar in your purse. Ideally, you should put off eating until your stomach is growling and it's difficult to concentrate.  Use it to lose it. When you feel the urge to eat, use the HALT method. Ask yourself, Am I really hungry? Or am I angry or anxious, lonely or bored, or tired? If you're still not certain, try the apple test. If you're truly hungry, an apple should seem delicious; if it doesn't, something else is going on. Or you can try drinking water and making yourself busy, if you are still hungry try a healthy snack.  4. Not All Calories Are Created Equal The mechanics of weight loss are pretty simple: Take in fewer calories than you use for energy. But the kind of food you eat makes all the difference. Processed food that's high in saturated fat and refined starch or sugar can cause inflammation that disrupts the hormone signals that tell   your brain you're full. The result: You eat a lot more.  Use it to lose it. Clean up your diet. Swap in whole, unprocessed foods, including vegetables, lean protein, and healthy fats that will fill you up and give you the biggest nutritional bang for your calorie buck. In a few weeks, as your brain starts receiving regular hunger and fullness signals once again, you'll notice that you feel less hungry overall and naturally start cutting back on the amount you eat.  5. Protein, Produce, and Plant-Based Fats Are Your Weight-Loss  [place] Here's why eating the three Ps regularly will help you drop pounds. Protein fills you up. You need it to build lean muscle, which keeps your metabolism humming so that you can torch more fat. People in a weight-loss program who ate double the recommended daily allowance for protein (about 110 grams for a 150-pound woman) lost 70 percent of their weight from fat, while people who ate the RDA lost only about 40 percent, one study found. Produce is packed with filling fiber. "It's very difficult to consume too many calories if you're eating a lot of vegetables. Example: Three cups of broccoli is a lot of food, yet only 93 calories. (Fruit is another story. It can be easy to overeat and can contain a lot of calories from sugar, so be sure to monitor your intake.) Plant-based fats like olive oil and those in avocados and nuts are healthy and extra satiating.  Use it to lose it. Aim to incorporate each of the three Ps into every meal and snack. People who eat protein throughout the day are able to keep weight off, according to a study in the American Journal of Clinical Nutrition. In addition to meat, poultry and seafood, good sources are beans, lentils, eggs, tofu, and yogurt. As for fat, keep portion sizes in check by measuring out salad dressing, oil, and nut butters (shoot for one to two tablespoons). Finally, eat veggies or a little fruit at every meal. People who did that consumed 308 fewer calories but didn't feel any hungrier than when they didn't eat more produce.  7. How You Eat Is As Important As What You Eat In order for your brain to register that you're full, you need to focus on what you're eating. Sit down whenever you eat, preferably at a table. Turn off the TV or computer, put down your phone, and look at your food. Smell it. Chew slowly, and don't put another bite on your fork until you swallow. When women ate lunch this attentively, they consumed 30 percent less when snacking later than  those who listened to an audiobook at lunchtime, according to a study in the British Journal of Nutrition. 8. Weighing Yourself Really Works The scale provides the best evidence about whether your efforts are paying off. Seeing the numbers tick up or down or stagnate is motivation to keep going-or to rethink your approach. A 2015 study at [employer] found that daily weigh-ins helped people lose more weight, keep it off, and maintain that loss, even after two years. Use it to lose it. Step on the scale at the same time every day for the best results. If your weight shoots up several pounds from one weigh-in to the next, don't freak out. Eating a lot of salt the night before or having your period is the likely culprit. The number should return to normal in a day or two. It's a steady climb that you need to do something about.   9. Too Much Stress and Too Little Sleep Are Your Enemies When you're tired and frazzled, your body cranks up the production of cortisol, the stress hormone that can cause carb cravings. Not getting enough sleep also boosts your levels of ghrelin, a hormone associated with hunger, while suppressing leptin, a hormone that signals fullness and satiety. People on a diet who slept only five and a half hours a night for two weeks lost 55 percent less fat and were hungrier than those who slept eight and a half hours, according to a study in the [employer] Medical Association Journal. Use it to lose it. Prioritize sleep, aiming for seven hours or more a night, which research shows helps lower stress. And make sure you're getting quality zzz's. If a snoring spouse or a fidgety cat wakes you up frequently throughout the night, you may end up getting the equivalent of just four hours of sleep, according to a study from [employer]. Keep pets out of the bedroom, and use a white-noise app to drown out snoring. 10. You Will Hit a plateau-And You Can Bust Through It As you slim down, your  body releases much less leptin, the fullness hormone.  If you're not strength training, start right now. Building muscle can raise your metabolism to help you overcome a plateau. To keep your body challenged and burning calories, incorporate new moves and more intense intervals into your workouts or add another sweat session to your weekly routine. Alternatively, cut an extra 100 calories or so a day from your diet. Now that you've lost weight, your body simply doesn't need as much fuel.   

## 2015-01-28 LAB — HEPATIC FUNCTION PANEL
ALBUMIN: 4.5 g/dL (ref 3.5–5.2)
ALT: 14 U/L (ref 0–35)
AST: 16 U/L (ref 0–37)
Alkaline Phosphatase: 72 U/L (ref 39–117)
BILIRUBIN TOTAL: 1.1 mg/dL (ref 0.2–1.2)
Bilirubin, Direct: 0.2 mg/dL (ref 0.0–0.3)
Indirect Bilirubin: 0.9 mg/dL (ref 0.2–1.2)
TOTAL PROTEIN: 7 g/dL (ref 6.0–8.3)

## 2015-01-28 LAB — BASIC METABOLIC PANEL WITH GFR
BUN: 11 mg/dL (ref 6–23)
CALCIUM: 9.7 mg/dL (ref 8.4–10.5)
CO2: 27 meq/L (ref 19–32)
CREATININE: 0.64 mg/dL (ref 0.50–1.10)
Chloride: 100 mEq/L (ref 96–112)
GFR, Est African American: >89 mL/min
GFR, Est Non African American: >89 mL/min
GLUCOSE: 93 mg/dL (ref 70–99)
Potassium: 4.2 mEq/L (ref 3.5–5.3)
Sodium: 139 mEq/L (ref 135–145)

## 2015-01-28 LAB — TSH: TSH: 2.007 u[IU]/mL (ref 0.350–4.500)

## 2015-01-28 LAB — LIPID PANEL
CHOL/HDL RATIO: 3.2 ratio
Cholesterol: 159 mg/dL (ref 0–200)
HDL: 49 mg/dL (ref >=46)
LDL CALC: 72 mg/dL (ref 0–99)
TRIGLYCERIDES: 188 mg/dL — AB (ref <150)
VLDL: 38 mg/dL (ref 0–40)

## 2015-01-28 LAB — IRON AND TIBC
%SAT: 38 % (ref 20–55)
Iron: 128 ug/dL (ref 42–145)
TIBC: 339 ug/dL (ref 250–470)
UIBC: 211 ug/dL (ref 125–400)

## 2015-01-28 LAB — FERRITIN: Ferritin: 22 ng/mL (ref 10–291)

## 2015-01-28 LAB — VITAMIN D 25 HYDROXY (VIT D DEFICIENCY, FRACTURES): Vit D, 25-Hydroxy: 49 ng/mL (ref 30–100)

## 2015-01-28 LAB — INSULIN, FASTING: Insulin fasting, serum: 6.9 u[IU]/mL (ref 2.0–19.6)

## 2015-01-28 LAB — MAGNESIUM: MAGNESIUM: 1.8 mg/dL (ref 1.5–2.5)

## 2015-02-19 ENCOUNTER — Ambulatory Visit (INDEPENDENT_AMBULATORY_CARE_PROVIDER_SITE_OTHER): Payer: Commercial Indemnity | Admitting: Physician Assistant

## 2015-02-19 ENCOUNTER — Encounter: Payer: Self-pay | Admitting: Physician Assistant

## 2015-02-19 ENCOUNTER — Other Ambulatory Visit: Payer: Self-pay | Admitting: Physician Assistant

## 2015-02-19 VITALS — BP 120/70 | HR 88 | Temp 97.7°F | Resp 16 | Ht 65.0 in | Wt 194.0 lb

## 2015-02-19 DIAGNOSIS — L918 Other hypertrophic disorders of the skin: Secondary | ICD-10-CM

## 2015-02-19 DIAGNOSIS — D235 Other benign neoplasm of skin of trunk: Secondary | ICD-10-CM

## 2015-02-19 DIAGNOSIS — D225 Melanocytic nevi of trunk: Secondary | ICD-10-CM

## 2015-02-19 DIAGNOSIS — E669 Obesity, unspecified: Secondary | ICD-10-CM

## 2015-02-19 NOTE — Patient Instructions (Signed)
Mole Excision Your caregiver has removed (excised) a mole. Most moles are benign (non cancerous). Some moles may change over time and require biopsy (tissue sample) or removal. The mole usually is removed by shaving or cutting it from the skin. You will have stitches in your skin if the mole is large. A small mole, or one that is shaved off, may require only a small bandage. Your caregiver will send a piece of the mole to the laboratory (pathology) to examine it under a microscope for signs of cancer. Make sure you get your biopsy results when you return for your follow-up visit. Call if there is no return visit. HOME CARE INSTRUCTIONS   If the biopsied area was the arm or leg, keep it raised (above the level of your heart) to decrease pain and swelling, if you are having any.  Keep the wound and dressing clean and dry. Clean as necessary.  If the dressing gets wet, remove it slowly and carefully. If it sticks, use warm, soapy water to gently loosen it. Pat the area dry with a clean towel before putting on another dressing.  Return in 7 days or as directed to have your sutures (stitches) removed.  Call in 3 to 4 days, or as directed, for the results of your biopsy. SEEK IMMEDIATE MEDICAL CARE IF:   You have a fever.  You have excess blood soaking through the dressing.  You have increasing pain and swelling in the wound.  You have numbness or swelling below the wound.  You have redness, swelling, pus, a bad smell, or red streaks coming away from the wound, or any other signs of infection. MAKE SURE YOU:   Understand these instructions.  Will watch your condition.  Will get help right away if you are not doing well or get worse. Document Released: 11/05/2000 Document Revised: 01/31/2012 Document Reviewed: 10/11/2007 [facility] Patient Information 2015 [place], [place]. This information is not intended to replace advice given to you by your health care provider. Make sure you discuss any  questions you have with your health care provider. Moles Moles are usually harmless growths on the skin. They are accumulations of color (pigment) cells in the skin that:   Can be various colors, from light brown to black.  Can appear anywhere on the body.  May remain flat or become raised.  May contain hairs.  May remain smooth or develop wrinkling. Most moles are not cancerous (benign). However, some moles may develop changes and become cancerous. It is important to check your moles every month. If you check your moles regularly, you will be able to notice any changes that may occur.  CAUSES  Moles occur when skin cells grow together in clusters instead of spreading out in the skin as they normally do. The reason for this clustering is unknown. DIAGNOSIS  Your caregiver will perform a skin examination to diagnose your mole.  TREATMENT  Moles usually do not require treatment. If a mole becomes worrisome, your caregiver may choose to take a sample of the mole or remove it entirely, and then send it to a lab for examination.  HOME CARE INSTRUCTIONS  Check your mole(s) monthly for changes that may indicate skin cancer. These changes can include:  A change in size.  A change in color. Note that moles tend to darken during pregnancy or when taking birth control pills (oral contraception).  A change in shape.  A change in the border of the mole.  Wear sunscreen (with an  SPF of at least 42) when you spend long periods of time outside. Reapply the sunscreen every 2-3 hours.  Schedule annual appointments with your skin doctor (dermatologist) if you have a large number of moles. SEEK MEDICAL CARE IF:  Your mole changes size, especially if it becomes larger than a pencil eraser.  Your mole changes in color or develops more than one color.  Your mole becomes itchy or bleeds.  Your mole, or the skin near the mole, becomes painful, sore, red, or swollen.  Your mole becomes scaly,  sheds skin, or oozes fluid.  Your mole develops irregular borders.  Your mole becomes flat or develops raised areas.  Your mole becomes hard or soft. Document Released: 08/03/2001 Document Revised: 08/02/2012 Document Reviewed: 05/22/2012 [facility] Patient Information 2015 [place], [place]. This information is not intended to replace advice given to you by your health care provider. Make sure you discuss any questions you have with your health care provider.

## 2015-02-19 NOTE — Progress Notes (Signed)
Chief Complaint: Patient presents for evaluation of a skin lesion, at CPE on 01/27/2015 found to have abnormal nevus. No personal history but + mother and MGM with skin cancer, unknown type.  Weight loss, discussed diet/exercise and possible prescription.   Exam: Left lower flank- 2x44mm very dark reg borders  Skin tag left Axilla  Anesthesia: Marcaine with epi   Procedure Details   The risks, benefits, indications, potential complications, and alternatives were explained to the patient and informed consent obtained.  ELECTRO: The lesion and surrounding area was given sterile prep using alcohol and draped in the usual sterile fashion. A 11 blade was used to excise an elliptical area of skin approximately 1cm by 1cm. The wound was closed with electrocaudry. Antibiotic ointment and a sterile dressing applied. The specimen was sent for pathologic examination. The patient tolerated the procedure well with minimal blood loss.   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 Instructed to keep the wound dry and covered for 24-48 hours and clean thereafter. 2. Warning signs of infection were reviewed.    3. Recommended that the patient use OTC acetaminophen as needed for pain.   4. Obesity with co morbidities- long discussion about weight loss, diet, and exercise            No medication at this time.

## 2015-03-12 ENCOUNTER — Telehealth: Payer: Self-pay | Admitting: Internal Medicine

## 2015-03-12 ENCOUNTER — Other Ambulatory Visit: Payer: Self-pay | Admitting: *Deleted

## 2015-03-12 DIAGNOSIS — D239 Other benign neoplasm of skin, unspecified: Secondary | ICD-10-CM

## 2015-03-12 NOTE — Telephone Encounter (Signed)
Patient called to get the results from mole pathology.  Advised area was abnormal and that a follow up with dermatology was reccommended. She is to call back with a In Engineer, building services.  I did give her some names per her request: Dr Jari Pigg, Dr Amy Martinique, Dr Allyson Sabal, Dr Crista Luria, and Dr Link Snuffer at [facility]; patient indicated she had be to [facility] previously, but would rather check with her insurance provider for in network specialist.  Thank you, [facility] [phone] Fax [phone]

## 2016-01-28 ENCOUNTER — Encounter: Payer: Self-pay | Admitting: Physician Assistant

## 2016-01-28 ENCOUNTER — Ambulatory Visit (INDEPENDENT_AMBULATORY_CARE_PROVIDER_SITE_OTHER): Payer: Managed Care, Other (non HMO) | Admitting: Physician Assistant

## 2016-01-28 VITALS — BP 120/76 | HR 68 | Temp 97.5°F | Ht 66.0 in | Wt 196.8 lb

## 2016-01-28 DIAGNOSIS — E559 Vitamin D deficiency, unspecified: Secondary | ICD-10-CM | POA: Diagnosis not present

## 2016-01-28 DIAGNOSIS — H8113 Benign paroxysmal vertigo, bilateral: Secondary | ICD-10-CM

## 2016-01-28 DIAGNOSIS — Z79899 Other long term (current) drug therapy: Secondary | ICD-10-CM | POA: Diagnosis not present

## 2016-01-28 DIAGNOSIS — E669 Obesity, unspecified: Secondary | ICD-10-CM

## 2016-01-28 DIAGNOSIS — M5136 Other intervertebral disc degeneration, lumbar region: Secondary | ICD-10-CM

## 2016-01-28 DIAGNOSIS — Z0001 Encounter for general adult medical examination with abnormal findings: Secondary | ICD-10-CM

## 2016-01-28 DIAGNOSIS — D239 Other benign neoplasm of skin, unspecified: Secondary | ICD-10-CM

## 2016-01-28 DIAGNOSIS — Z86018 Personal history of other benign neoplasm: Secondary | ICD-10-CM | POA: Insufficient documentation

## 2016-01-28 DIAGNOSIS — Z13220 Encounter for screening for lipoid disorders: Secondary | ICD-10-CM

## 2016-01-28 DIAGNOSIS — I10 Essential (primary) hypertension: Secondary | ICD-10-CM | POA: Diagnosis not present

## 2016-01-28 DIAGNOSIS — Z1389 Encounter for screening for other disorder: Secondary | ICD-10-CM

## 2016-01-28 DIAGNOSIS — Z0000 Encounter for general adult medical examination without abnormal findings: Secondary | ICD-10-CM

## 2016-01-28 DIAGNOSIS — D649 Anemia, unspecified: Secondary | ICD-10-CM

## 2016-01-28 DIAGNOSIS — R6889 Other general symptoms and signs: Secondary | ICD-10-CM | POA: Diagnosis not present

## 2016-01-28 DIAGNOSIS — Z131 Encounter for screening for diabetes mellitus: Secondary | ICD-10-CM

## 2016-01-28 DIAGNOSIS — T7840XD Allergy, unspecified, subsequent encounter: Secondary | ICD-10-CM

## 2016-01-28 DIAGNOSIS — M51369 Other intervertebral disc degeneration, lumbar region without mention of lumbar back pain or lower extremity pain: Secondary | ICD-10-CM

## 2016-01-28 LAB — CBC WITH DIFFERENTIAL/PLATELET
Basophils Absolute: 0.1 10*3/uL (ref 0.0–0.1)
Basophils Relative: 1 % (ref 0–1)
EOS PCT: 3 % (ref 0–5)
Eosinophils Absolute: 0.2 10*3/uL (ref 0.0–0.7)
HCT: 41.4 % (ref 36.0–46.0)
Hemoglobin: 14.1 g/dL (ref 12.0–15.0)
LYMPHS PCT: 30 % (ref 12–46)
Lymphs Abs: 2.1 10*3/uL (ref 0.7–4.0)
MCH: 29 pg (ref 26.0–34.0)
MCHC: 34.1 g/dL (ref 30.0–36.0)
MCV: 85.2 fL (ref 78.0–100.0)
MONO ABS: 0.6 10*3/uL (ref 0.1–1.0)
MPV: 9.4 fL (ref 8.6–12.4)
Monocytes Relative: 9 % (ref 3–12)
Neutro Abs: 4 10*3/uL (ref 1.7–7.7)
Neutrophils Relative %: 57 % (ref 43–77)
PLATELETS: 289 10*3/uL (ref 150–400)
RBC: 4.86 MIL/uL (ref 3.87–5.11)
RDW: 13.4 % (ref 11.5–15.5)
WBC: 7.1 10*3/uL (ref 4.0–10.5)

## 2016-01-28 NOTE — Progress Notes (Signed)
Complete Physical  Assessment and Plan: 1. Obesity Obesity with co morbidities- long discussion about weight loss, diet, and exercise - TSH - EKG 12-Lead  2. Allergy, subsequent encounter Continue OTC allergy pills and flonase  3. Anemia, unspecified anemia type - CBC with Differential/Platelet - Iron and TIBC - Ferritin - Vitamin B12  4. Vitamin D deficiency - VITAMIN D 25 Hydroxy (Vit-D Deficiency, Fractures)  5. Medication management - CBC with Differential/Platelet - BASIC METABOLIC PANEL WITH GFR - Hepatic function panel - Magnesium  6. DDD (degenerative disc disease), lumbar RICE, NSAIDS, exercises given, if not better get xray and PT referral or ortho referral.   7. Dysplastic nevus Follows derm  8. Routine general medical examination at a health care facility - CBC with Differential/Platelet - BASIC METABOLIC PANEL WITH GFR - Hepatic function panel - TSH - Lipid panel - Hemoglobin A1c - Insulin, fasting - Magnesium - Urinalysis, Routine w reflex microscopic (not at [facility]) - Microalbumin / creatinine urine ratio - VITAMIN D 25 Hydroxy (Vit-D Deficiency, Fractures) - Iron and TIBC - Ferritin - Vitamin B12 - EKG 12-Lead  9. Screening for blood or protein in urine - Urinalysis, Routine w reflex microscopic (not at [facility]) - Microalbumin / creatinine urine ratio  10. Screening cholesterol level - Lipid panel  11. Screening for diabetes mellitus - Hemoglobin A1c - Insulin, fasting  12. Benign paroxysmal positional vertigo, bilateral Get on allergy pill, normal neuro, flonase, exercises given, if worsening symptoms follow up here or ER  Health Maintenance  Discussed med's effects and SE's. Screening labs and tests as requested with regular follow-up as recommended. HPI 40 y.o. female  presents for a complete physical. Her blood pressure has been controlled at home, today their BP is BP: 120/76 mmHg She is in a running group with [employer], runs 3  days a week, 9 miles. No CP, SOB.  Has had vertigo last few weeks, last for seconds, worse in bed rolling over, no tinnitus, hearing changes, HA, minor vision changes but has not seen eye doctor in year, no double vision.  Has history of anemia, off iron pills.  She is on 1000 IU daily.  Lab Results  Component Value Date   VD25OH 93 01/27/2015   She is married, with 66 year old son, in 1st grade. She works in [employer] and has been stressful. BMI is Body mass index is 31.78 kg/(m^2)., she is working on diet and exercise. Wt Readings from Last 3 Encounters:  01/28/16 196 lb 12.8 oz (89.268 kg)  02/19/15 194 lb (87.998 kg)  01/27/15 196 lb (88.905 kg)     Current Medications:  Current Outpatient Prescriptions on File Prior to Visit  Medication Sig Dispense Refill  . aspirin 81 MG tablet Take 81 mg by mouth daily.    . cholecalciferol (VITAMIN D) 1000 UNITS tablet Take 1,000 Units by mouth daily.    . Multiple Vitamin (MULTIVITAMIN WITH MINERALS) TABS tablet Take 1 tablet by mouth daily.     No current facility-administered medications on file prior to visit.   Health Maintenance:  Immunization History  Administered Date(s) Administered  . Tdap 10/07/2011   Tetanus: 2012 Pneumovax: N/A Prevnar 13: N/A Flu vaccine: 08/2013 normally at work but did not get this year Zostavax: N/A  Pap: 2016, had mirena, menses are better.  3DMGM: 12/2015 Patient's last menstrual period was 01/19/2016. DEXA: N/A Colonoscopy: N/A EGD: N/A  Allergies: No Known Allergies Medical History:  Past Medical History  Diagnosis Date  .  Allergy   . DDD (degenerative disc disease)   . Endometriosis    Surgical History:  Past Surgical History  Procedure Laterality Date  . Cervical fusion      C3-C4  C5-C6  . Colposcopy      age 54  . Cesarean section    . Wisdom tooth extraction    . Laparoscopic abdominal exploration  [phone]    due to endometriosis   Family History:  Family History  Problem  Relation Age of Onset  . Cancer Mother     Skin  . Heart disease Father   . Cancer Maternal Aunt     throat   Social History:  Social History  Substance Use Topics  . Smoking status: Never Smoker   . Smokeless tobacco: Never Used  . Alcohol Use: Yes     Comment: Rare/social   Review of Systems  Constitutional: Negative.   HENT: Negative.   Eyes: Negative.   Respiratory: Negative.   Cardiovascular: Negative.   Gastrointestinal: Negative.   Genitourinary: Negative.   Musculoskeletal: Negative.   Skin: Negative.   Neurological: Negative.   Endo/Heme/Allergies: Negative.   Psychiatric/Behavioral: Negative.    Physical Exam: Estimated body mass index is 31.78 kg/(m^2) as calculated from the following:   Height as of this encounter: 5\' 6"  (1.676 m).   Weight as of this encounter: 196 lb 12.8 oz (89.268 kg). Filed Vitals:   01/28/16 0908  BP: 120/76  Pulse: 68  Temp: 97.5 F (36.4 C)   General Appearance: Well nourished, in no apparent distress. Eyes: PERRLA, EOMs, conjunctiva no swelling or erythema, normal fundi and vessels. Sinuses: No Frontal/maxillary tenderness ENT/Mouth: Ext aud canals clear,  TMs without erythema, bulging, + bilateral effusion.  Good dentition. No erythema, swelling, or exudate on post pharynx. Tonsils not swollen or erythematous. Hearing normal.  Neck: Supple, thyroid normal. No bruits Respiratory: Respiratory effort normal, BS equal bilaterally without rales, rhonchi, wheezing or stridor. Cardio: RRR without murmurs, rubs or gallops. Brisk peripheral pulses without edema.  Chest: symmetric, with normal excursions and percussion. Breasts: Symmetric, with lumps,without nipple discharge, retractions. Abdomen: Soft, +BS. Non tender, no guarding, rebound, hernias, masses, or organomegaly. .  Lymphatics: Non tender without lymphadenopathy.  Genitourinary: defer Musculoskeletal: Full ROM all peripheral extremities,5/5 strength, and normal gait. Skin:  Warm, dry without rashes, lesions, ecchymosis. Neuro: Cranial nerves intact, reflexes equal bilaterally. Normal muscle tone, no cerebellar symptoms. Sensation intact.  Psych: Awake and oriented X 3, normal affect, Insight and Judgment appropriate.   EKG: defer   Vicie Mutters 9:21 AM

## 2016-01-28 NOTE — Patient Instructions (Signed)
Please pick one of the over the counter allergy medications below and take it once daily for allergies.  Claritin or loratadine cheapest but likely the weakest  Zyrtec or certizine at night because it can make you sleepy The strongest is allegra or fexafinadine  Cheapest at [employer], [employer], [employer]  Benign Paroxysmal Positional Vertigo (BPPV)  General Information In Benign Paroxysmal Positional Vertigo (BPPV) dizziness is generally thought to be due to debris which has collected within a part of the inner ear. This debris can be thought of as "ear rocks", although the formal name is "otoconia". Ear rocks are small crystals of calcium carbonate derived from a structure in the ear called the "utricle" (figure to the right ). The symptoms of BPPV include dizziness or vertigo, lightheadedness, imbalance, and nausea. Activities which bring on symptoms will vary among persons, but symptoms are usually followed by a change of position of the head like getting out of bed or rolling over in bed are common "problem" motions .  However if you have worsening HA, changes vision/speech, weakness go to the ER     What can be done? 1) Use two or more pillows at night. Avoid sleeping on the "bad" side. In the morning, get up slowly and sit on the edge of the bed for a minute. 2) Medication prescribed by your doctor.  3) The exercises below, you can do at home to help you prevent the sensation later in the day.  4) We can refer you to physical therapy at [facility], # Cogswell treatments: The Brandt-Daroff Exercises are a home method of treating BPPV,and are effective 95% of the time.  These exercises are performed in three sets per day for two weeks. In each set, one performs the maneuver as shown five times. Start sitting upright (position 1). Then move into the side-lying position (position 2), with the head angled upward about halfway. An easy way to remember this is to imagine  someone standing about 6 feet in front of you, and just keep looking at their head at all times. Stay in the side-lying position for 30 seconds, or until the dizziness subsides if this is longer, then go back to the sitting position (position 3). Stay there for 30 seconds, and then go to the opposite side (position 4) and follow the same routine.  At home Epley Maneuver This procedure seems to be even more effective than the in-office procedure, perhaps because it is repeated every night for a week.  The method (for the left side) is performed as shown on the figure below.  1) One stays in each of the supine (lying down) positions for 30 seconds, and in the sitting upright position (top) for 1 minute.  2) Thus, once cycle takes 2 1/2 minutes.  3) Typically 3 cycles are performed just prior to going to sleep.  4) It is best to do them at night rather than in the morning or midday, as if one becomes dizzy following the exercises, then it can resolve while one is sleeping.  The mirror image of this procedure is used for the right ear.

## 2016-01-29 LAB — BASIC METABOLIC PANEL WITH GFR
BUN: 15 mg/dL (ref 7–25)
CALCIUM: 9.9 mg/dL (ref 8.6–10.2)
CO2: 30 mmol/L (ref 20–31)
Chloride: 103 mmol/L (ref 98–110)
Creat: 0.68 mg/dL (ref 0.50–1.10)
GFR, Est Non African American: >89 mL/min (ref >=60)
GLUCOSE: 90 mg/dL (ref 65–99)
Potassium: 4.6 mmol/L (ref 3.5–5.3)
Sodium: 138 mmol/L (ref 135–146)

## 2016-01-29 LAB — MICROALBUMIN / CREATININE URINE RATIO
CREATININE, URINE: 34 mg/dL (ref 20–320)
MICROALB UR: 0.2 mg/dL
MICROALB/CREAT RATIO: 6 ug/mg{creat} (ref <30)

## 2016-01-29 LAB — LIPID PANEL
CHOLESTEROL: 161 mg/dL (ref 125–200)
HDL: 50 mg/dL (ref >=46)
LDL Cholesterol: 74 mg/dL (ref <130)
Total CHOL/HDL Ratio: 3.2 Ratio (ref <=5.0)
Triglycerides: 184 mg/dL — ABNORMAL HIGH (ref <150)
VLDL: 37 mg/dL — ABNORMAL HIGH (ref <30)

## 2016-01-29 LAB — INSULIN, FASTING: INSULIN FASTING, SERUM: 3.2 u[IU]/mL (ref 2.0–19.6)

## 2016-01-29 LAB — FERRITIN: Ferritin: 55 ng/mL (ref 10–232)

## 2016-01-29 LAB — URINALYSIS, ROUTINE W REFLEX MICROSCOPIC
Bilirubin Urine: NEGATIVE
GLUCOSE, UA: NEGATIVE
HGB URINE DIPSTICK: NEGATIVE
Ketones, ur: NEGATIVE
LEUKOCYTES UA: NEGATIVE
NITRITE: NEGATIVE
Protein, ur: NEGATIVE
Specific Gravity, Urine: 1.01 (ref 1.001–1.035)
pH: 6 (ref 5.0–8.0)

## 2016-01-29 LAB — VITAMIN D 25 HYDROXY (VIT D DEFICIENCY, FRACTURES): VIT D 25 HYDROXY: 56 ng/mL (ref 30–100)

## 2016-01-29 LAB — IRON AND TIBC
%SAT: 47 % (ref 11–50)
Iron: 145 ug/dL (ref 40–190)
TIBC: 308 ug/dL (ref 250–450)
UIBC: 163 ug/dL (ref 125–400)

## 2016-01-29 LAB — HEPATIC FUNCTION PANEL
ALBUMIN: 4.3 g/dL (ref 3.6–5.1)
ALT: 13 U/L (ref 6–29)
AST: 14 U/L (ref 10–30)
Alkaline Phosphatase: 62 U/L (ref 33–115)
Bilirubin, Direct: 0.2 mg/dL (ref <=0.2)
Indirect Bilirubin: 1.2 mg/dL (ref 0.2–1.2)
TOTAL PROTEIN: 7.1 g/dL (ref 6.1–8.1)
Total Bilirubin: 1.4 mg/dL — ABNORMAL HIGH (ref 0.2–1.2)

## 2016-01-29 LAB — MAGNESIUM: MAGNESIUM: 1.7 mg/dL (ref 1.5–2.5)

## 2016-01-29 LAB — HEMOGLOBIN A1C
Hgb A1c MFr Bld: 5 % (ref <5.7)
Mean Plasma Glucose: 97 mg/dL (ref <117)

## 2016-01-29 LAB — TSH: TSH: 2.68 m[IU]/L

## 2016-01-29 LAB — VITAMIN B12: VITAMIN B 12: 409 pg/mL (ref 200–1100)

## 2016-02-06 ENCOUNTER — Encounter: Payer: Self-pay | Admitting: Physician Assistant

## 2016-02-25 ENCOUNTER — Ambulatory Visit: Payer: Self-pay | Admitting: Physician Assistant

## 2016-03-13 ENCOUNTER — Encounter: Payer: Self-pay | Admitting: *Deleted

## 2016-11-05 ENCOUNTER — Ambulatory Visit (INDEPENDENT_AMBULATORY_CARE_PROVIDER_SITE_OTHER): Payer: Managed Care, Other (non HMO) | Admitting: Physician Assistant

## 2016-11-05 ENCOUNTER — Encounter: Payer: Self-pay | Admitting: Physician Assistant

## 2016-11-05 VITALS — BP 130/76 | HR 76 | Temp 97.9°F | Resp 14 | Wt 200.0 lb

## 2016-11-05 DIAGNOSIS — M545 Low back pain, unspecified: Secondary | ICD-10-CM

## 2016-11-05 MED ORDER — PREDNISONE 20 MG PO TABS: ORAL_TABLET | ORAL | 0 refills | Status: AC

## 2016-11-05 MED ORDER — CYCLOBENZAPRINE HCL 10 MG PO TABS: 10.0000 mg | ORAL_TABLET | Freq: Three times a day (TID) | ORAL | 0 refills | Status: DC | PRN

## 2016-11-05 NOTE — Progress Notes (Signed)
Subjective:    Patient ID: Paula Garcia, female    DOB: 06-20-1976, 40 y.o.   MRN: [id number]  HPI 40 y.o. WF with back pain down right thigh, patient is runner but has not had back pain like this in the past. .  She states she got home from work Monday, felt the need to "pop" her back, went to work out and then the next day had lower back pain with radiation down anterior thigh to knee and leg feels "heavy", some tingling lower leg but no pain, saw chiropractor Wednesday. Worse with standing and lying down at night. She has been doing ibuprofen and aleve that is not helping.  Patient denies fever, hematuria, incontinence, numbness, weakness and saddle anesthesia  Blood pressure 130/76, pulse 76, temperature 97.9 F (36.6 C), resp. rate 14, weight 200 lb (90.7 kg).  Medications Current Outpatient Prescriptions on File Prior to Visit  Medication Sig  . aspirin 81 MG tablet Take 81 mg by mouth daily.  . cholecalciferol (VITAMIN D) 1000 UNITS tablet Take 1,000 Units by mouth daily.  . Multiple Vitamin (MULTIVITAMIN WITH MINERALS) TABS tablet Take 1 tablet by mouth daily.   No current facility-administered medications on file prior to visit.     Problem list She has Allergy; DDD (degenerative disc disease), lumbar; Obesity; Anemia; and Dysplastic nevus on her problem list.   Review of Systems  Constitutional: Negative for chills, fatigue and fever.  Gastrointestinal: Negative for nausea and vomiting.  Genitourinary: Negative for difficulty urinating, dysuria, flank pain, frequency, hematuria and urgency.  Musculoskeletal: Positive for back pain.       Objective:   Physical Exam  Constitutional: She is oriented to person, place, and time. She appears well-developed and well-nourished.  HENT:  Head: Normocephalic and atraumatic.  Eyes: Conjunctivae are normal. Pupils are equal, round, and reactive to light.  Neck: Normal range of motion. Neck supple.  Cardiovascular: Normal  rate and regular rhythm.   Pulmonary/Chest: Effort normal and breath sounds normal.  Abdominal: Soft. Bowel sounds are normal. There is no tenderness.  Musculoskeletal:  Patient is able to ambulate well. Gait is  Antalgic. Straight leg raising with dorsiflexion negative bilaterally for radicular symptoms. Sensory exam in the legs are abnormal with some decreased sensation right foot and medial right leg. Knee reflexes are abnormal on right side Ankle reflexes are normal Strength is normal and symmetric in arms and legs. There is SI tenderness to palpation.  There is not paraspinal muscle spasm.  There is not midline tenderness.  ROM of spine with  limited in all spheres due to pain. + back pain/right hip pain with flexion and internal rotation.    Lymphadenopathy:    She has no cervical adenopathy.  Neurological: She is alert and oriented to person, place, and time. She has normal reflexes.  Skin: Skin is warm and dry. No rash noted.      Assessment & Plan:  1. Low back pain radiating to right lower extremity Sciatica versus piriformis  Lower back pain- negative straight leg but some decreased sensation/decreased reflex Prednisone was prescribed,NSAIDs, RICE, and exercise given - predniSONE (DELTASONE) 20 MG tablet; 1 pill 3 x a day for 3 days, 1 pill 2 x a day x 3 days, 1 pill a day x 5 days with food  Dispense: 20 tablet; Refill: 0 - cyclobenzaprine (FLEXERIL) 10 MG tablet; Take 1 tablet (10 mg total) by mouth 3 (three) times daily as needed for muscle spasms.  Dispense:  30 tablet; Refill: 0 - Ambulatory referral to Orthopedic Surgery

## 2016-11-05 NOTE — Patient Instructions (Addendum)
If you have any weakness in your legs, severe pain, numbness in your inner thighs, change in urinary habits please go to the ER Rest x 2 weeks, then can start bike/light stretches below  Make sure you are on an allergy pill, see below for more details. Please take the prednisone as directed below, this is NOT an antibiotic so you do NOT have to finish it. You can take it for a few days and stop it if you are doing better.   Please take the prednisone to help decrease inflammation and therefore decrease symptoms. Take it it with food to avoid GI upset. It can cause increased energy but on the other hand it can make it hard to sleep at night so please take it AT [place], it takes 8-12 hours to start working so it will NOT affect your sleeping if you take it at night with your food!!  If you are diabetic it will increase your sugars so decrease carbs and monitor your sugars closely.      Low Back Sprain Rehab Ask your health care provider which exercises are safe for you. Do exercises exactly as told by your health care provider and adjust them as directed. It is normal to feel mild stretching, pulling, tightness, or discomfort as you do these exercises, but you should stop right away if you feel sudden pain or your pain gets worse. Do not begin these exercises until told by your health care provider. Stretching and range of motion exercises These exercises warm up your muscles and joints and improve the movement and flexibility of your back. These exercises also help to relieve pain, numbness, and tingling. Exercise A: Lumbar rotation 1. Lie on your back on a firm surface and bend your knees. 2. Straighten your arms out to your sides so each arm forms an "L" shape with a side of your body (a 90 degree angle). 3. Slowly move both of your knees to one side of your body until you feel a stretch in your lower back. Try not to let your shoulders move off of the floor. 4. Hold for __________  seconds. 5. Tense your abdominal muscles and slowly move your knees back to the starting position. 6. Repeat this exercise on the other side of your body. Repeat __________ times. Complete this exercise __________ times a day. Exercise B: Prone extension on elbows 1. Lie on your abdomen on a firm surface. 2. Prop yourself up on your elbows. 3. Use your arms to help lift your chest up until you feel a gentle stretch in your abdomen and your lower back.  This will place some of your body weight on your elbows. If this is uncomfortable, try stacking pillows under your chest.  Your hips should stay down, against the surface that you are lying on. Keep your hip and back muscles relaxed. 4. Hold for __________ seconds. 5. Slowly relax your upper body and return to the starting position. Repeat __________ times. Complete this exercise __________ times a day. Strengthening exercises These exercises build strength and endurance in your back. Endurance is the ability to use your muscles for a long time, even after they get tired. Exercise C: Pelvic tilt 1. Lie on your back on a firm surface. Bend your knees and keep your feet flat. 2. Tense your abdominal muscles. Tip your pelvis up toward the ceiling and flatten your lower back into the floor.  To help with this exercise, you may place a small towel  under your lower back and try to push your back into the towel. 3. Hold for __________ seconds. 4. Let your muscles relax completely before you repeat this exercise. Repeat __________ times. Complete this exercise __________ times a day. Exercise D: Alternating arm and leg raises 1. Get on your hands and knees on a firm surface. If you are on a hard floor, you may want to use padding to cushion your knees, such as an exercise mat. 2. Line up your arms and legs. Your hands should be below your shoulders, and your knees should be below your hips. 3. Lift your left leg behind you. At the same time, raise  your right arm and straighten it in front of you.  Do not lift your leg higher than your hip.  Do not lift your arm higher than your shoulder.  Keep your abdominal and back muscles tight.  Keep your hips facing the ground.  Do not arch your back.  Keep your balance carefully, and do not hold your breath. 4. Hold for __________ seconds. 5. Slowly return to the starting position and repeat with your right leg and your left arm. Repeat __________ times. Complete this exercise __________ times a day. Exercise E: Abdominal set with straight leg raise 1. Lie on your back on a firm surface. 2. Bend one of your knees and keep your other leg straight. 3. Tense your abdominal muscles and lift your straight leg up, 4-6 inches (10-15 cm) off the ground. 4. Keep your abdominal muscles tight and hold for __________ seconds.  Do not hold your breath.  Do not arch your back. Keep it flat against the ground. 5. Keep your abdominal muscles tense as you slowly lower your leg back to the starting position. 6. Repeat with your other leg. Repeat __________ times. Complete this exercise __________ times a day. Posture and body mechanics   Body mechanics refers to the movements and positions of your body while you do your daily activities. Posture is part of body mechanics. Good posture and healthy body mechanics can help to relieve stress in your body's tissues and joints. Good posture means that your spine is in its natural S-curve position (your spine is neutral), your shoulders are pulled back slightly, and your head is not tipped forward. The following are general guidelines for applying improved posture and body mechanics to your everyday activities. Standing   When standing, keep your spine neutral and your feet about hip-width apart. Keep a slight bend in your knees. Your ears, shoulders, and hips should line up.  When you do a task in which you stand in one place for a long time, place one foot  up on a stable object that is 2-4 inches (5-10 cm) high, such as a footstool. This helps keep your spine neutral. Sitting  When sitting, keep your spine neutral and keep your feet flat on the floor. Use a footrest, if necessary, and keep your thighs parallel to the floor. Avoid rounding your shoulders, and avoid tilting your head forward.  When working at a desk or a computer, keep your desk at a height where your hands are slightly lower than your elbows. Slide your chair under your desk so you are close enough to maintain good posture.  When working at a computer, place your monitor at a height where you are looking straight ahead and you do not have to tilt your head forward or downward to look at the screen. Resting   When lying down  and resting, avoid positions that are most painful for you.  If you have pain with activities such as sitting, bending, stooping, or squatting (flexion-based activities), lie in a position in which your body does not bend very much. For example, avoid curling up on your side with your arms and knees near your chest (fetal position).  If you have pain with activities such as standing for a long time or reaching with your arms (extension-based activities), lie with your spine in a neutral position and bend your knees slightly. Try the following positions:  Lying on your side with a pillow between your knees.  Lying on your back with a pillow under your knees. Lifting   When lifting objects, keep your feet at least shoulder-width apart and tighten your abdominal muscles.  Bend your knees and hips and keep your spine neutral. It is important to lift using the strength of your legs, not your back. Do not lock your knees straight out.  Always ask for help to lift heavy or awkward objects. This information is not intended to replace advice given to you by your health care provider. Make sure you discuss any questions you have with your health care  provider. Document Released: 11/08/2005 Document Revised: 07/15/2016 Document Reviewed: 08/20/2015 Elsevier Interactive Patient Education  2017 [employer].

## 2016-11-06 ENCOUNTER — Other Ambulatory Visit: Payer: Self-pay | Admitting: Internal Medicine

## 2016-11-06 MED ORDER — TRAMADOL HCL 50 MG PO TABS: ORAL_TABLET | ORAL | 0 refills | Status: DC

## 2016-11-29 ENCOUNTER — Other Ambulatory Visit: Payer: Self-pay | Admitting: Physician Assistant

## 2016-11-29 MED ORDER — TRAMADOL HCL 50 MG PO TABS: ORAL_TABLET | ORAL | 0 refills | Status: AC

## 2016-11-29 NOTE — Progress Notes (Signed)
Tramadol was called into pharmacy.

## 2016-12-07 ENCOUNTER — Encounter: Payer: Self-pay | Admitting: Physician Assistant

## 2016-12-07 DIAGNOSIS — R200 Anesthesia of skin: Secondary | ICD-10-CM

## 2016-12-07 DIAGNOSIS — R202 Paresthesia of skin: Secondary | ICD-10-CM

## 2016-12-07 DIAGNOSIS — M5136 Other intervertebral disc degeneration, lumbar region: Secondary | ICD-10-CM

## 2016-12-23 ENCOUNTER — Ambulatory Visit (INDEPENDENT_AMBULATORY_CARE_PROVIDER_SITE_OTHER): Payer: Self-pay | Admitting: Specialist

## 2017-01-26 ENCOUNTER — Ambulatory Visit (INDEPENDENT_AMBULATORY_CARE_PROVIDER_SITE_OTHER): Payer: Self-pay | Admitting: Surgery

## 2017-01-28 ENCOUNTER — Ambulatory Visit (INDEPENDENT_AMBULATORY_CARE_PROVIDER_SITE_OTHER): Payer: Self-pay | Admitting: Specialist

## 2017-02-08 ENCOUNTER — Encounter: Payer: Self-pay | Admitting: Physician Assistant

## 2017-02-25 ENCOUNTER — Ambulatory Visit: Payer: Commercial Indemnity | Admitting: Neurology

## 2017-03-17 NOTE — Progress Notes (Signed)
Complete Physical  Assessment and Plan: Obesity Obesity with co morbidities- long discussion about weight loss, diet, and exercise - TSH  Allergy, subsequent encounter Continue OTC allergy pills and flonase   Anemia, unspecified anemia type - CBC with Differential/Platelet   Vitamin D deficiency - VITAMIN D 25 Hydroxy (Vit-D Deficiency, Fractures)  Medication management - CBC with Differential/Platelet - BASIC METABOLIC PANEL WITH GFR - Hepatic function panel - Magnesium  DDD (degenerative disc disease), lumbar RICE, NSAIDS, exercises given, if not better get xray and PT referral or ortho referral.   Dysplastic nevus Follows derm   Routine general medical examination at a health care facility  Screening for blood or protein in urine - Urinalysis, Routine w reflex microscopic (not at [facility]) - Microalbumin / creatinine urine ratio  Screening cholesterol level - Lipid panel   Screening for diabetes mellitus - Hemoglobin A1c - Insulin, fasting   Benign paroxysmal positional vertigo, bilateral Get on allergy pill, improved  Health Maintenance  Discussed med's effects and SE's. Screening labs and tests as requested with regular follow-up as recommended. HPI 41 y.o. female  presents for a complete physical. Her blood pressure has been controlled at home, today their BP is BP: 118/72 She is not running yet due to back, but back is better, will start running again.  No CP, SOB.  Has history of anemia, off iron pills.  Lab Results  Component Value Date   WBC 7.1 01/28/2016   HGB 14.1 01/28/2016   HCT 41.4 01/28/2016   MCV 85.2 01/28/2016   PLT 289 01/28/2016   Lab Results  Component Value Date   CHOL 161 01/28/2016   HDL 50 01/28/2016   LDLCALC 74 01/28/2016   TRIG 184 (H) 01/28/2016   CHOLHDL 3.2 01/28/2016    She is not on vitamin D Lab Results  Component Value Date   VD25OH 63 01/28/2016   She is married, with 53 year old son, in 2nd grade. Quit job in  [employer], starts new job on the 14th in [place], [place].  BMI is Body mass index is 33.4 kg/m., she is working on diet and exercise. Wt Readings from Last 3 Encounters:  03/18/17 203 lb 12.8 oz (92.4 kg)  11/05/16 200 lb (90.7 kg)  01/28/16 196 lb 12.8 oz (89.3 kg)     Current Medications:  No current outpatient prescriptions on file prior to visit.   No current facility-administered medications on file prior to visit.    Health Maintenance:  Immunization History  Administered Date(s) Administered  . Tdap 10/07/2011   Tetanus: 2012 Pneumovax: N/A Prevnar 13: N/A Flu vaccine: 2014 declines Zostavax: N/A  Pap: 2016, had mirena, menses are better, due next year GYN.  3DMGM: 12/2015, has done in July at [facility] Patient's last menstrual period was 03/10/2017. DEXA: N/A Colonoscopy: N/A EGD: N/A  Medical History:  Past Medical History:  Diagnosis Date  . Allergy   . DDD (degenerative disc disease)   . Endometriosis   Allergies No Known Allergies  SURGICAL HISTORY She  has a past surgical history that includes Cervical fusion; Colposcopy; Cesarean section; Wisdom tooth extraction; and Laparoscopic abdominal exploration (6948,5462). FAMILY HISTORY Her family history includes Cancer in her maternal aunt and mother; Heart disease in her father. SOCIAL HISTORY She  reports that she has never smoked. She has never used smokeless tobacco. She reports that she drinks alcohol. She reports that she does not use drugs.   Review of Systems  Constitutional: Negative.   HENT: Negative.  Eyes: Negative.   Respiratory: Negative.   Cardiovascular: Negative.   Gastrointestinal: Negative.   Genitourinary: Negative.   Musculoskeletal: Negative.   Skin: Negative.   Neurological: Negative.   Endo/Heme/Allergies: Negative.   Psychiatric/Behavioral: Negative.    Physical Exam: Estimated body mass index is 33.4 kg/m as calculated from the following:   Height as of this encounter: 5' 5.5"  (1.664 m).   Weight as of this encounter: 203 lb 12.8 oz (92.4 kg). Vitals:   03/18/17 0937  BP: 118/72  Pulse: 91  Resp: 16  Temp: 97.3 F (36.3 C)   General Appearance: Well nourished, in no apparent distress. Eyes: PERRLA, EOMs, conjunctiva no swelling or erythema, normal fundi and vessels. Sinuses: No Frontal/maxillary tenderness ENT/Mouth: Ext aud canals clear,  TMs without erythema, bulging, + bilateral effusion.  Good dentition. No erythema, swelling, or exudate on post pharynx. Tonsils not swollen or erythematous. Hearing normal.  Neck: Supple, thyroid normal. No bruits Respiratory: Respiratory effort normal, BS equal bilaterally without rales, rhonchi, wheezing or stridor. Cardio: RRR without murmurs, rubs or gallops. Brisk peripheral pulses without edema.  Chest: symmetric, with normal excursions and percussion. Breasts: Symmetric, with lumps,without nipple discharge, retractions. Abdomen: Soft, +BS. Non tender, no guarding, rebound, hernias, masses, or organomegaly. .  Lymphatics: Non tender without lymphadenopathy.  Genitourinary: defer Musculoskeletal: Full ROM all peripheral extremities,5/5 strength, and normal gait. Skin: 4 mm upper back dark nevus, good borders/color, right upper 2x3- monitor Warm, dry without rashes, lesions, ecchymosis. Neuro: Cranial nerves intact, reflexes equal bilaterally. Normal muscle tone, no cerebellar symptoms. Sensation intact.  Psych: Awake and oriented X 3, normal affect, Insight and Judgment appropriate.   EKG: defer   Vicie Mutters 9:45 AM

## 2017-03-18 ENCOUNTER — Ambulatory Visit (INDEPENDENT_AMBULATORY_CARE_PROVIDER_SITE_OTHER): Payer: Managed Care, Other (non HMO) | Admitting: Physician Assistant

## 2017-03-18 VITALS — BP 118/72 | HR 91 | Temp 97.3°F | Resp 16 | Ht 65.5 in | Wt 203.8 lb

## 2017-03-18 DIAGNOSIS — Z131 Encounter for screening for diabetes mellitus: Secondary | ICD-10-CM

## 2017-03-18 DIAGNOSIS — D239 Other benign neoplasm of skin, unspecified: Secondary | ICD-10-CM

## 2017-03-18 DIAGNOSIS — Z0000 Encounter for general adult medical examination without abnormal findings: Secondary | ICD-10-CM

## 2017-03-18 DIAGNOSIS — Z79899 Other long term (current) drug therapy: Secondary | ICD-10-CM

## 2017-03-18 DIAGNOSIS — T7840XD Allergy, unspecified, subsequent encounter: Secondary | ICD-10-CM

## 2017-03-18 DIAGNOSIS — D649 Anemia, unspecified: Secondary | ICD-10-CM

## 2017-03-18 DIAGNOSIS — Z1389 Encounter for screening for other disorder: Secondary | ICD-10-CM

## 2017-03-18 DIAGNOSIS — Z13220 Encounter for screening for lipoid disorders: Secondary | ICD-10-CM

## 2017-03-18 DIAGNOSIS — H8113 Benign paroxysmal vertigo, bilateral: Secondary | ICD-10-CM

## 2017-03-18 DIAGNOSIS — E559 Vitamin D deficiency, unspecified: Secondary | ICD-10-CM

## 2017-03-18 DIAGNOSIS — M5136 Other intervertebral disc degeneration, lumbar region: Secondary | ICD-10-CM

## 2017-03-18 LAB — BASIC METABOLIC PANEL WITH GFR
BUN: 13 mg/dL (ref 7–25)
CALCIUM: 9.6 mg/dL (ref 8.6–10.2)
CO2: 25 mmol/L (ref 20–31)
CREATININE: 0.68 mg/dL (ref 0.50–1.10)
Chloride: 103 mmol/L (ref 98–110)
GFR, Est Non African American: >89 mL/min (ref >=60)
GLUCOSE: 88 mg/dL (ref 65–99)
Potassium: 4.2 mmol/L (ref 3.5–5.3)
Sodium: 138 mmol/L (ref 135–146)

## 2017-03-18 LAB — CBC WITH DIFFERENTIAL/PLATELET
Basophils Absolute: 59 cells/uL (ref 0–200)
Basophils Relative: 1 %
EOS ABS: 177 {cells}/uL (ref 15–500)
Eosinophils Relative: 3 %
HEMATOCRIT: 41.7 % (ref 35.0–45.0)
Hemoglobin: 14.2 g/dL (ref 11.7–15.5)
LYMPHS PCT: 31 %
Lymphs Abs: 1829 cells/uL (ref 850–3900)
MCH: 29.1 pg (ref 27.0–33.0)
MCHC: 34.1 g/dL (ref 32.0–36.0)
MCV: 85.5 fL (ref 80.0–100.0)
MONO ABS: 649 {cells}/uL (ref 200–950)
MONOS PCT: 11 %
MPV: 9.6 fL (ref 7.5–12.5)
NEUTROS PCT: 54 %
Neutro Abs: 3186 cells/uL (ref 1500–7800)
PLATELETS: 244 10*3/uL (ref 140–400)
RBC: 4.88 MIL/uL (ref 3.80–5.10)
RDW: 13.2 % (ref 11.0–15.0)
WBC: 5.9 10*3/uL (ref 3.8–10.8)

## 2017-03-18 LAB — HEPATIC FUNCTION PANEL
ALBUMIN: 4.3 g/dL (ref 3.6–5.1)
ALT: 18 U/L (ref 6–29)
AST: 18 U/L (ref 10–30)
Alkaline Phosphatase: 68 U/L (ref 33–115)
BILIRUBIN TOTAL: 1.4 mg/dL — AB (ref 0.2–1.2)
Bilirubin, Direct: 0.2 mg/dL (ref <=0.2)
Indirect Bilirubin: 1.2 mg/dL (ref 0.2–1.2)
TOTAL PROTEIN: 6.9 g/dL (ref 6.1–8.1)

## 2017-03-18 LAB — LIPID PANEL
CHOLESTEROL: 156 mg/dL (ref <200)
HDL: 49 mg/dL — AB (ref >50)
LDL Cholesterol: 72 mg/dL (ref <100)
TRIGLYCERIDES: 177 mg/dL — AB (ref <150)
Total CHOL/HDL Ratio: 3.2 Ratio (ref <5.0)
VLDL: 35 mg/dL — ABNORMAL HIGH (ref <30)

## 2017-03-18 NOTE — Patient Instructions (Signed)
Simple math prevails.    1st - exercise does not produce significant weight loss - at best one converts fat into muscle , "bulks up", loses inches, but usually stays "weight neutral"     2nd - think of your body weightas a check book: If you eat more calories than you burn up - you save money or gain weight .... Or if you spend more money than you put in the check book, ie burn up more calories than you eat, then you lose weight     3rd - if you walk or run 1 mile, you burn up 100 calories - you have to burn up 3,500 calories to lose 1 pound, ie you have to walk/run 35 miles to lose 1 measly pound. So if you want to lose 10 #, then you have to walk/run 350 miles, so.... clearly exercise is not the solution.     4. So if you consume 1,500 calories, then you have to burn up the equivalent of 15 miles to stay weight neutral - It also stands to reason that if you consume 1,500 cal/day and don't lose weight, then you must be burning up about 1,500 cals/day to stay weight neutral.     5. If you really want to lose weight, you must cut your calorie intake 300 calories /day and at that rate you should lose about 1 # every 3 days.   6. Please purchase Dr Joel Fuhrman's book(s) "The End of Dieting" & "Eat to Live" . It has some great concepts and recipes.      

## 2017-03-19 LAB — URINALYSIS, ROUTINE W REFLEX MICROSCOPIC
Bilirubin Urine: NEGATIVE
GLUCOSE, UA: NEGATIVE
Hgb urine dipstick: NEGATIVE
Ketones, ur: NEGATIVE
LEUKOCYTES UA: NEGATIVE
Nitrite: NEGATIVE
PH: 7 (ref 5.0–8.0)
Protein, ur: NEGATIVE
SPECIFIC GRAVITY, URINE: 1.011 (ref 1.001–1.035)

## 2017-03-19 LAB — MICROALBUMIN / CREATININE URINE RATIO
Creatinine, Urine: 50 mg/dL (ref 20–320)
MICROALB UR: 0.3 mg/dL
MICROALB/CREAT RATIO: 6 ug/mg{creat} (ref <30)

## 2017-03-19 LAB — HEMOGLOBIN A1C
Hgb A1c MFr Bld: 4.7 % (ref <5.7)
Mean Plasma Glucose: 88 mg/dL

## 2017-03-19 LAB — VITAMIN D 25 HYDROXY (VIT D DEFICIENCY, FRACTURES): Vit D, 25-Hydroxy: 40 ng/mL (ref 30–100)

## 2017-03-19 LAB — MAGNESIUM: Magnesium: 1.8 mg/dL (ref 1.5–2.5)

## 2017-03-19 LAB — TSH: TSH: 2.22 m[IU]/L

## 2017-06-01 ENCOUNTER — Encounter: Payer: Self-pay | Admitting: Internal Medicine

## 2018-02-09 ENCOUNTER — Encounter: Payer: Self-pay | Admitting: Physician Assistant

## 2018-03-21 ENCOUNTER — Encounter: Payer: Self-pay | Admitting: Physician Assistant

## 2018-03-26 NOTE — Progress Notes (Signed)
Complete Physical  Assessment and Plan:  Paula Garcia was seen today for annual exam.  Diagnoses and all orders for this visit:  Routine general medical examination at a health care facility  DDD (degenerative disc disease), lumbar Symptoms well managed at this time, intermittent, mild Has after running occasionally Uses OTC analgesics rarely  Class 1 obesity due to excess calories without serious comorbidity in adult, unspecified BMI Long discussion about weight loss, diet, and exercise Recommended diet heavy in fruits and veggies and low in animal meats, cheeses, and dairy products, appropriate calorie intake Discussed appropriate weight for height  Follow up at next visit  Dysplastic nevus Remote, established with dermatology  Allergic state, subsequent encounter OTC allergy meds PRN  Medication management -     CBC with Differential/Platelet -     COMPLETE METABOLIC PANEL WITH GFR  Screening for diabetes mellitus -     Hemoglobin A1c  Screening cholesterol level -     Lipid panel  Vitamin D deficiency -     VITAMIN D 25 Hydroxy (Vit-D Deficiency, Fractures)  Screening for blood or protein in urine -     Urinalysis w microscopic + reflex cultur -     Microalbumin / creatinine urine ratio  Screening for thyroid disorder -     TSH  Screening for deficiency anemia -     Vitamin B12  Screening cardiovascular condition - EKG  Discussed med's effects and SE's. Screening labs and tests as requested with regular follow-up as recommended. Over 40 minutes of exam, counseling, chart review, and complex, high level critical decision making was performed this visit.   Future Appointments  Date Time Provider [name]  03/29/2019  9:00 AM Liane Comber, NP [facility] None     HPI  42 y.o. married Caucasian female  presents for a complete physical and follow up for has Allergy; DDD (degenerative disc disease), lumbar; Obesity; Anemia; and Dysplastic nevus on  their problem list. She has not complaints at this time. Sees OBGYN Dr. Willis Modena annually.   BMI is Body mass index is 32.87 kg/m., she has been working on diet and exercise. She estimates water intake between 50 fluid ounces. She reports she drinks 3 cups of coffee daily. Very rarely drinks soda. She religiously exercises 3 days a week - with a running group.  Wt Readings from Last 3 Encounters:  03/27/18 200 lb 9.6 oz (91 kg)  03/18/17 203 lb 12.8 oz (92.4 kg)  11/05/16 200 lb (90.7 kg)   Today their BP is BP: 110/70 She does workout. She denies chest pain, shortness of breath, dizziness.    The cholesterol last visit was:   Lab Results  Component Value Date   CHOL 156 03/18/2017   HDL 49 (L) 03/18/2017   LDLCALC 72 03/18/2017   TRIG 177 (H) 03/18/2017   CHOLHDL 3.2 03/18/2017    Last A1C in the office was:  Lab Results  Component Value Date   HGBA1C 4.7 03/18/2017   Last GFR: Lab Results  Component Value Date   GFRNONAA >89 03/18/2017   Patient is not currently on Vitamin D supplement.   Lab Results  Component Value Date   VD25OH 40 03/18/2017      Current Medications:  Current Outpatient Medications on File Prior to Visit  Medication Sig Dispense Refill  . cetirizine (ZYRTEC) 10 MG tablet Take 10 mg by mouth as needed for allergies.     No current facility-administered medications on file prior to visit.  Allergies:  No Known Allergies Medical History:  She has Allergy; DDD (degenerative disc disease), lumbar; Obesity; Anemia; and Dysplastic nevus on their problem list. Health Maintenance:   Immunization History  Administered Date(s) Administered  . Tdap 10/07/2011   Tetanus: 2012 Pneumovax: N/A Prevnar 13: N/A Flu vaccine: 2014 declines Zostavax: N/A  LMP: Patient's last menstrual period was 03/07/2018 (exact date). Pap: 2017?, had mirena, has upcoming appointment 3DMGM: 05/2017, [facility]  DEXA: N/A Colonoscopy: N/A EGD: N/A  Last Dental Exam:  11/2017 q6 months Last Eye Exam: Remote  Patient Care Team: Unk Pinto, MD as PCP - General (Internal Medicine)  Surgical History:  She has a past surgical history that includes Cervical fusion (2012); Colposcopy; Cesarean section (2010); Wisdom tooth extraction; and Laparoscopic abdominal exploration (2094,7096). Family History:  Herfamily history includes Brain cancer in her paternal grandmother; COPD in her father; Cancer in her maternal aunt and mother; Heart disease in her father; Stroke in her maternal grandfather. Social History:  She reports that she has never smoked. She has never used smokeless tobacco. She reports that she drinks alcohol. She reports that she does not use drugs.  Review of Systems: Review of Systems  Constitutional: Negative for malaise/fatigue and weight loss.  HENT: Negative for hearing loss and tinnitus.   Eyes: Negative for blurred vision and double vision.  Respiratory: Negative for cough, shortness of breath and wheezing.   Cardiovascular: Negative for chest pain, palpitations, orthopnea, claudication and leg swelling.  Gastrointestinal: Negative for abdominal pain, blood in stool, constipation, diarrhea, heartburn, melena, nausea and vomiting.  Genitourinary: Negative.   Musculoskeletal: Negative for joint pain and myalgias.  Skin: Negative for rash.  Neurological: Negative for dizziness, tingling, sensory change, weakness and headaches.  Endo/Heme/Allergies: Negative for polydipsia.  Psychiatric/Behavioral: Negative.   All other systems reviewed and are negative.   Physical Exam: Estimated body mass index is 32.87 kg/m as calculated from the following:   Height as of this encounter: 5' 5.5" (1.664 m).   Weight as of this encounter: 200 lb 9.6 oz (91 kg). BP 110/70   Pulse 71   Temp 97.7 F (36.5 C)   Ht 5' 5.5" (1.664 m)   Wt 200 lb 9.6 oz (91 kg)   LMP 03/07/2018 (Exact Date)   SpO2 98%   BMI 32.87 kg/m  General Appearance: Well  nourished, in no apparent distress.  Eyes: PERRLA, EOMs, conjunctiva no swelling or erythema, normal fundi and vessels.  Sinuses: No Frontal/maxillary tenderness  ENT/Mouth: Ext aud canals clear, normal light reflex with TMs without erythema, bulging. Good dentition. No erythema, swelling, or exudate on post pharynx. Tonsils not swollen or erythematous. Hearing normal.  Neck: Supple, thyroid normal. No bruits  Respiratory: Respiratory effort normal, BS equal bilaterally without rales, rhonchi, wheezing or stridor.  Cardio: RRR without murmurs, rubs or gallops. Brisk peripheral pulses without edema.  Chest: symmetric, with normal excursions and percussion.  Breasts: Defer to GYN  Abdomen: Soft, nontender, no guarding, rebound, hernias, masses, or organomegaly.  Lymphatics: Non tender without lymphadenopathy.  Genitourinary: Defer to GYN Musculoskeletal: Full ROM all peripheral extremities,5/5 strength, and normal gait.  Skin: Warm, dry without rashes, lesions, ecchymosis. Neuro: Cranial nerves intact, reflexes equal bilaterally. Normal muscle tone, no cerebellar symptoms. Sensation intact.  Psych: Awake and oriented X 3, normal affect, Insight and Judgment appropriate.   EKG: WNL no ST changes.  Izora Ribas 9:29 AM [facility]

## 2018-03-27 ENCOUNTER — Encounter: Payer: Self-pay | Admitting: Adult Health

## 2018-03-27 ENCOUNTER — Ambulatory Visit (INDEPENDENT_AMBULATORY_CARE_PROVIDER_SITE_OTHER): Payer: 59 | Admitting: Adult Health

## 2018-03-27 VITALS — BP 110/70 | HR 71 | Temp 97.7°F | Ht 65.5 in | Wt 200.6 lb

## 2018-03-27 DIAGNOSIS — Z1329 Encounter for screening for other suspected endocrine disorder: Secondary | ICD-10-CM

## 2018-03-27 DIAGNOSIS — Z0000 Encounter for general adult medical examination without abnormal findings: Secondary | ICD-10-CM | POA: Diagnosis not present

## 2018-03-27 DIAGNOSIS — Z79899 Other long term (current) drug therapy: Secondary | ICD-10-CM

## 2018-03-27 DIAGNOSIS — Z1389 Encounter for screening for other disorder: Secondary | ICD-10-CM

## 2018-03-27 DIAGNOSIS — T7840XD Allergy, unspecified, subsequent encounter: Secondary | ICD-10-CM

## 2018-03-27 DIAGNOSIS — Z136 Encounter for screening for cardiovascular disorders: Secondary | ICD-10-CM | POA: Diagnosis not present

## 2018-03-27 DIAGNOSIS — Z130 Encounter for screening for diseases of the blood and blood-forming organs and certain disorders involving the immune mechanism: Secondary | ICD-10-CM

## 2018-03-27 DIAGNOSIS — Z131 Encounter for screening for diabetes mellitus: Secondary | ICD-10-CM

## 2018-03-27 DIAGNOSIS — D649 Anemia, unspecified: Secondary | ICD-10-CM

## 2018-03-27 DIAGNOSIS — D239 Other benign neoplasm of skin, unspecified: Secondary | ICD-10-CM

## 2018-03-27 DIAGNOSIS — I10 Essential (primary) hypertension: Secondary | ICD-10-CM

## 2018-03-27 DIAGNOSIS — E559 Vitamin D deficiency, unspecified: Secondary | ICD-10-CM

## 2018-03-27 DIAGNOSIS — E6609 Other obesity due to excess calories: Secondary | ICD-10-CM

## 2018-03-27 DIAGNOSIS — Z13220 Encounter for screening for lipoid disorders: Secondary | ICD-10-CM

## 2018-03-27 DIAGNOSIS — M5136 Other intervertebral disc degeneration, lumbar region: Secondary | ICD-10-CM

## 2018-03-27 NOTE — Patient Instructions (Signed)
Aim for 7+ servings of fruits and vegetables daily  80+ fluid ounces of water or unsweet tea for healthy kidneys  Limit alcohol intake to max 1 drink per day  Limit animal fats in diet for cholesterol and heart health - choose grass fed whenever available  Aim for low stress - take time to unwind and care for your mental health  Aim for 150 min of moderate intensity exercise weekly for heart health, and weights twice weekly for bone health  Aim for 7-9 hours of sleep daily   Intermittent fasting is more about strategy than starvation. It's meant to reset your body in different ways, hopefully with fitness and nutrition changes as a result.  Like any big switchover, though, results may vary when it comes down to the individual level. What works for your friends may not work for you, or vice versa. That's why it's helpful to play around with variations on intermittent fasting and healthy habits and find what works best for you.  WHAT IS INTERMITTENT FASTING AND WHY DO IT?  Intermittent fasting doesn't involve specific foods, but rather, a strict schedule regarding when you eat. Also called "time-restricted eating," the tactic has been praised for its contribution to weight loss, improved body composition, and decreased cravings. Preliminary research also suggests it may be beneficial for glucose tolerance, hormone regulation, better muscle mass and lower body fat.  Part of its appeal is the simplicity of the effort. Unlike some other trends, there's no calculations to intermittent fasting.  You simply eat within a certain block of time, usually a window of 8-10 hours. In the other big block of time - about 14-16 hours, including when you're asleep - you don't eat anything, not even snacks. You can drink water, coffee, tea or any other beverage that doesn't have calories.  For example, if you like having a late dinner, you might skip breakfast and have your first meal at noon and your last  meal of the day at 8 p.m., and then not eat until noon again the next day.  IDEAS FOR GETTING STARTED  If you're new to the strategy, it may be helpful to eat within the typical circadian rhythm and keep eating within daylight hours. This can be especially beneficial if you're looking at intermittent fasting for weight-loss goals.  So first try only eating between 12pm to 8pm.  Outside of this time you may have water, black coffee, and hot tea. You may not eat it drink anything that has carbs, sugars, OR artificial sugars like diet soda.   Like any major eating and fitness shift, it can take time to find the perfect fit, so don't be afraid to experiment with different options - including ditching intermittent fasting altogether if it's simply not for you. But if it is, you may be surprised by some of the benefits that come along with the strategy.

## 2018-03-28 ENCOUNTER — Other Ambulatory Visit: Payer: Self-pay | Admitting: Adult Health

## 2018-03-28 DIAGNOSIS — R945 Abnormal results of liver function studies: Principal | ICD-10-CM

## 2018-03-28 DIAGNOSIS — R7989 Other specified abnormal findings of blood chemistry: Secondary | ICD-10-CM

## 2018-03-28 LAB — MICROALBUMIN / CREATININE URINE RATIO
CREATININE, URINE: 28 mg/dL (ref 20–275)
Microalb, Ur: <0.2 mg/dL

## 2018-03-28 LAB — URINALYSIS W MICROSCOPIC + REFLEX CULTURE
Bacteria, UA: NONE SEEN /HPF
Bilirubin Urine: NEGATIVE
Glucose, UA: NEGATIVE
HGB URINE DIPSTICK: NEGATIVE
Hyaline Cast: NONE SEEN /LPF
KETONES UR: NEGATIVE
LEUKOCYTE ESTERASE: NEGATIVE
NITRITES URINE, INITIAL: NEGATIVE
PROTEIN: NEGATIVE
RBC / HPF: NONE SEEN /HPF (ref 0–2)
SQUAMOUS EPITHELIAL / LPF: NONE SEEN /HPF (ref <=5)
Specific Gravity, Urine: 1.008 (ref 1.001–1.03)
WBC UA: NONE SEEN /HPF (ref 0–5)
pH: 6.5 (ref 5.0–8.0)

## 2018-03-28 LAB — CBC WITH DIFFERENTIAL/PLATELET
BASOS PCT: 0.8 %
Basophils Absolute: 61 cells/uL (ref 0–200)
EOS PCT: 1.7 %
Eosinophils Absolute: 129 cells/uL (ref 15–500)
HCT: 40.1 % (ref 35.0–45.0)
HEMOGLOBIN: 13.9 g/dL (ref 11.7–15.5)
Lymphs Abs: 1816 cells/uL (ref 850–3900)
MCH: 28.7 pg (ref 27.0–33.0)
MCHC: 34.7 g/dL (ref 32.0–36.0)
MCV: 82.7 fL (ref 80.0–100.0)
MONOS PCT: 9.1 %
MPV: 10 fL (ref 7.5–12.5)
Neutro Abs: 4902 cells/uL (ref 1500–7800)
Neutrophils Relative %: 64.5 %
Platelets: 281 10*3/uL (ref 140–400)
RBC: 4.85 10*6/uL (ref 3.80–5.10)
RDW: 12.5 % (ref 11.0–15.0)
Total Lymphocyte: 23.9 %
WBC mixed population: 692 cells/uL (ref 200–950)
WBC: 7.6 10*3/uL (ref 3.8–10.8)

## 2018-03-28 LAB — LIPID PANEL
Cholesterol: 165 mg/dL
HDL: 58 mg/dL
LDL Cholesterol (Calc): 88 mg/dL
Non-HDL Cholesterol (Calc): 107 mg/dL
Total CHOL/HDL Ratio: 2.8 (calc)
Triglycerides: 98 mg/dL

## 2018-03-28 LAB — HEMOGLOBIN A1C
Hgb A1c MFr Bld: 5 %{Hb}
Mean Plasma Glucose: 97 (calc)
eAG (mmol/L): 5.4 (calc)

## 2018-03-28 LAB — COMPLETE METABOLIC PANEL WITH GFR
AG RATIO: 1.8 (calc) (ref 1.0–2.5)
ALKALINE PHOSPHATASE (APISO): 82 U/L (ref 33–115)
ALT: 38 U/L — AB (ref 6–29)
AST: 24 U/L (ref 10–30)
Albumin: 4.6 g/dL (ref 3.6–5.1)
BILIRUBIN TOTAL: 1.4 mg/dL — AB (ref 0.2–1.2)
BUN: 14 mg/dL (ref 7–25)
CHLORIDE: 102 mmol/L (ref 98–110)
CO2: 28 mmol/L (ref 20–32)
Calcium: 9.7 mg/dL (ref 8.6–10.2)
Creat: 0.8 mg/dL (ref 0.50–1.10)
GFR, Est African American: 105 mL/min/{1.73_m2} (ref >=60)
GFR, Est Non African American: 91 mL/min/{1.73_m2} (ref >=60)
GLUCOSE: 91 mg/dL (ref 65–99)
Globulin: 2.6 g/dL (calc) (ref 1.9–3.7)
POTASSIUM: 4 mmol/L (ref 3.5–5.3)
Sodium: 136 mmol/L (ref 135–146)
Total Protein: 7.2 g/dL (ref 6.1–8.1)

## 2018-03-28 LAB — TSH: TSH: 2.84 mIU/L

## 2018-03-28 LAB — GAMMA GT: GGT: 24 U/L (ref 3–55)

## 2018-03-28 LAB — VITAMIN D 25 HYDROXY (VIT D DEFICIENCY, FRACTURES): Vit D, 25-Hydroxy: 42 ng/mL (ref 30–100)

## 2018-03-28 LAB — TEST AUTHORIZATION

## 2018-03-28 LAB — VITAMIN B12: Vitamin B-12: 397 pg/mL (ref 200–1100)

## 2018-03-28 LAB — NO CULTURE INDICATED

## 2018-06-29 ENCOUNTER — Ambulatory Visit: Payer: Self-pay | Admitting: Adult Health

## 2018-07-03 ENCOUNTER — Other Ambulatory Visit: Payer: Self-pay

## 2018-07-03 ENCOUNTER — Other Ambulatory Visit: Payer: 59

## 2018-07-03 ENCOUNTER — Ambulatory Visit: Payer: Self-pay | Admitting: Adult Health

## 2018-07-03 DIAGNOSIS — R945 Abnormal results of liver function studies: Principal | ICD-10-CM

## 2018-07-03 DIAGNOSIS — R7989 Other specified abnormal findings of blood chemistry: Secondary | ICD-10-CM

## 2018-07-03 LAB — HM MAMMOGRAPHY

## 2018-07-04 LAB — LIPID PANEL
CHOL/HDL RATIO: 3.6 (calc) (ref <5.0)
Cholesterol: 172 mg/dL (ref <200)
HDL: 48 mg/dL — AB (ref >50)
LDL CHOLESTEROL (CALC): 97 mg/dL
NON-HDL CHOLESTEROL (CALC): 124 mg/dL (ref <130)
Triglycerides: 172 mg/dL — ABNORMAL HIGH (ref <150)

## 2018-07-04 LAB — HEPATIC FUNCTION PANEL
AG Ratio: 1.6 (calc) (ref 1.0–2.5)
ALT: 21 U/L (ref 6–29)
AST: 16 U/L (ref 10–30)
Albumin: 4.4 g/dL (ref 3.6–5.1)
Alkaline phosphatase (APISO): 76 U/L (ref 33–115)
BILIRUBIN DIRECT: 0.2 mg/dL (ref 0.0–0.2)
GLOBULIN: 2.7 g/dL (ref 1.9–3.7)
Indirect Bilirubin: 0.8 mg/dL (calc) (ref 0.2–1.2)
Total Bilirubin: 1 mg/dL (ref 0.2–1.2)
Total Protein: 7.1 g/dL (ref 6.1–8.1)

## 2018-07-04 LAB — TEST AUTHORIZATION

## 2018-07-27 ENCOUNTER — Encounter: Payer: Self-pay | Admitting: Internal Medicine

## 2018-09-06 ENCOUNTER — Ambulatory Visit (INDEPENDENT_AMBULATORY_CARE_PROVIDER_SITE_OTHER): Payer: 59

## 2018-09-06 DIAGNOSIS — Z23 Encounter for immunization: Secondary | ICD-10-CM

## 2019-03-29 ENCOUNTER — Encounter: Payer: Self-pay | Admitting: Adult Health

## 2019-05-21 NOTE — Progress Notes (Signed)
Complete Physical  Assessment and Plan:  Paula Garcia was seen today for annual exam.  Diagnoses and all orders for this visit:  Routine general medical examination at a health care facility  DDD (degenerative disc disease), lumbar Symptoms well managed at this time, intermittent, mild Has after running occasionally Uses OTC analgesics rarely  Class 1 obesity due to excess calories without serious comorbidity in adult, unspecified BMI Long discussion about weight loss, diet, and exercise Recommended diet heavy in fruits and veggies and low in animal meats, cheeses, and dairy products, appropriate calorie intake Discussed appropriate weight for height  Follow up at next visit  Dysplastic nevus Remote, established with dermatology - needs to follow up, she will schedule  Allergic state, subsequent encounter OTC allergy meds PRN  Medication management -     CBC with Differential/Platelet -     COMPLETE METABOLIC PANEL WITH GFR -     Urinalysis w microscopic  -     Magnesium   Hypertriglyceridemia Mild; lifestyle modification only  Continue low cholesterol diet and exercise.  Check lipid panel.  -     Lipid panel -     TSH  Vitamin D deficiency -     VITAMIN D 25 Hydroxy (Vit-D Deficiency, Fractures)  Screening deficiency anemia On B12 supplement, check CBC, reflex iron panel if abnormalities   Discussed med's effects and SE's. Screening labs and tests as requested with regular follow-up as recommended. Over 40 minutes of exam, counseling, chart review, and complex, high level critical decision making was performed this visit.   Future Appointments  Date Time Provider [place]  05/29/2020  9:00 AM Liane Comber, NP [facility] None     HPI  43 y.o. married Caucasian female  presents for a complete physical and follow up for has Environmental allergies; DDD (degenerative disc disease), lumbar; Obesity; Anemia; and History of dysplastic nevus on their problem  list.   She is married, works in [employer], 1 child, boy  She has no complaints at this time. Sees OBGYN Dr. Willis Modena annually.   BMI is Body mass index is 32.75 kg/m., she has been working on diet and exercise. She estimates water intake between 50 fluid ounces. She reports she drinks 3 cups of coffee daily. Very rarely drinks soda. 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  Wt Readings from Last 3 Encounters:  05/23/19 201 lb 6.4 oz (91.4 kg)  03/27/18 200 lb 9.6 oz (91 kg)  03/18/17 203 lb 12.8 oz (92.4 kg)   Breakfast: coffee with milk, skips breakfast if not working up [employer]: salad - grilled chicken, spinach, veggies, vinaigrette, or wrap with protein, veggies and light condiments Snack: occasional - hard boiled egg, apple Dinner: Whatever husband cooks - spaghetti, protein with veggies, etc  Hydration: 70 ounces    Today their BP is BP: 122/76 She does workout. She denies chest pain, shortness of breath, dizziness.    The cholesterol last visit was:   Lab Results  Component Value Date   CHOL 172 07/03/2018   HDL 48 (L) 07/03/2018   LDLCALC 97 07/03/2018   TRIG 172 (H) 07/03/2018   CHOLHDL 3.6 07/03/2018    Last A1C in the office was:  Lab Results  Component Value Date   HGBA1C 5.0 03/27/2018   Last GFR: Lab Results  Component Value Date   GFRNONAA 91 03/27/2018   Patient is not currently on Vitamin D supplement, unsure of dose   Lab Results  Component Value Date   VD25OH 42 03/27/2018     She is on B vitamin complex Lab Results  Component Value Date   GEXBMWUX32 440 03/27/2018     Current Medications:  Current Outpatient Medications on File Prior to Visit  Medication Sig Dispense Refill  . cetirizine (ZYRTEC) 10 MG tablet Take 10 mg by mouth as needed for allergies.    . Cholecalciferol (VITAMIN D3 PO) Take by mouth.    . Cyanocobalamin (VITAMIN B12 PO) Take by mouth.    . Omega-3 Fatty Acids (FISH OIL PO) Take by mouth.    .  Zinc 50 MG TABS Take by mouth.     No current facility-administered medications on file prior to visit.    Allergies:  No Known Allergies Medical History:  She has Environmental allergies; DDD (degenerative disc disease), lumbar; Obesity; Anemia; and History of dysplastic nevus on their problem list. Health Maintenance:   Immunization History  Administered Date(s) Administered  . Influenza Inj Mdck Quad With Preservative 09/06/2018  . Tdap 10/07/2011   Tetanus: 2012 Pneumovax: N/A Prevnar 13: N/A Flu vaccine: 2019 Zostavax: N/A  LMP: Patient's last menstrual period was 05/13/2019 (exact date). Pap: 2017?, had mirena, has upcoming appointment with GYN 3DMGM: 06/2018, [facility]  DEXA: N/A Colonoscopy: N/A EGD: N/A  Last Dental Exam: 2019, q6 months, has scheduled Last Eye Exam: Remote Last Derm Exam: Never   Patient Care Team: Unk Pinto, MD as PCP - General (Internal Medicine)  Surgical History:  She has a past surgical history that includes Cervical fusion (2012); Colposcopy; Cesarean section (2010); Wisdom tooth extraction; and Laparoscopic abdominal exploration (1027,2536). Family History:  Herfamily history includes Brain cancer in her paternal grandmother; COPD in her father; Cancer in her mother; Congenital Murmur in her son; Heart disease in her father; Stroke in her maternal grandfather; Throat cancer in her maternal aunt. Social History:  She reports that she has never smoked. She has never used smokeless tobacco. She reports current alcohol use. She reports that she does not use drugs.  Review of Systems: Review of Systems  Constitutional: Negative for malaise/fatigue and weight loss.  HENT: Negative for hearing loss and tinnitus.   Eyes: Negative for blurred vision and double vision.  Respiratory: Negative for cough, shortness of breath and wheezing.   Cardiovascular: Negative for chest pain, palpitations, orthopnea, claudication and leg swelling.   Gastrointestinal: Negative for abdominal pain, blood in stool, constipation, diarrhea, heartburn, melena, nausea and vomiting.  Genitourinary: Negative.   Musculoskeletal: Negative for joint pain and myalgias.  Skin: Negative for rash.  Neurological: Negative for dizziness, tingling, sensory change, weakness and headaches.  Endo/Heme/Allergies: Negative for polydipsia.  Psychiatric/Behavioral: Negative.   All other systems reviewed and are negative.   Physical Exam: Estimated body mass index is 32.75 kg/m as calculated from the following:   Height as of this encounter: 5' 5.75" (1.67 m).   Weight as of this encounter: 201 lb 6.4 oz (91.4 kg). BP 122/76   Pulse 76   Temp 97.7 F (36.5 C)   Ht 5' 5.75" (1.67 m)   Wt 201 lb 6.4 oz (91.4 kg)   LMP 05/13/2019 (Exact Date)   SpO2 98%   BMI 32.75 kg/m  General Appearance: Well nourished, in no apparent distress.  Eyes: PERRLA, EOMs, conjunctiva no swelling or erythema, normal fundi and vessels.  Sinuses: No Frontal/maxillary tenderness  ENT/Mouth: Ext aud canals clear, normal light reflex with TMs without erythema, bulging. Good dentition. No erythema, swelling, or exudate  on post pharynx. Tonsils not swollen or erythematous. Hearing normal.  Neck: Supple, thyroid normal. No bruits  Respiratory: Respiratory effort normal, BS equal bilaterally without rales, rhonchi, wheezing or stridor.  Cardio: RRR without murmurs, rubs or gallops. Brisk peripheral pulses without edema.  Chest: symmetric, with normal excursions and percussion.  Breasts: Defer to GYN  Abdomen: Soft, nontender, no guarding, rebound, hernias, masses, or organomegaly.  Lymphatics: Non tender without lymphadenopathy.  Genitourinary: Defer to GYN Musculoskeletal: Full ROM all peripheral extremities,5/5 strength, and normal gait.  Skin: Warm, dry without rashes, ecchymosis. She has numerous small nevi, flat, <6 mm; two to back with irregular borders  Neuro: Cranial nerves  intact, reflexes equal bilaterally. Normal muscle tone, no cerebellar symptoms. Sensation intact.  Psych: Awake and oriented X 3, normal affect, Insight and Judgment appropriate.   EKG: WNL no ST changes in 2019, no concerns, low risk, defer   Paula Garcia 9:21 AM [facility]

## 2019-05-23 ENCOUNTER — Ambulatory Visit (INDEPENDENT_AMBULATORY_CARE_PROVIDER_SITE_OTHER): Payer: 59 | Admitting: Adult Health

## 2019-05-23 ENCOUNTER — Encounter: Payer: Self-pay | Admitting: Adult Health

## 2019-05-23 ENCOUNTER — Other Ambulatory Visit: Payer: Self-pay

## 2019-05-23 VITALS — BP 122/76 | HR 76 | Temp 97.7°F | Ht 65.75 in | Wt 201.4 lb

## 2019-05-23 DIAGNOSIS — M5136 Other intervertebral disc degeneration, lumbar region: Secondary | ICD-10-CM

## 2019-05-23 DIAGNOSIS — E559 Vitamin D deficiency, unspecified: Secondary | ICD-10-CM

## 2019-05-23 DIAGNOSIS — Z86018 Personal history of other benign neoplasm: Secondary | ICD-10-CM

## 2019-05-23 DIAGNOSIS — E781 Pure hyperglyceridemia: Secondary | ICD-10-CM

## 2019-05-23 DIAGNOSIS — E6609 Other obesity due to excess calories: Secondary | ICD-10-CM

## 2019-05-23 DIAGNOSIS — Z131 Encounter for screening for diabetes mellitus: Secondary | ICD-10-CM

## 2019-05-23 DIAGNOSIS — Z0000 Encounter for general adult medical examination without abnormal findings: Secondary | ICD-10-CM | POA: Diagnosis not present

## 2019-05-23 DIAGNOSIS — Z9109 Other allergy status, other than to drugs and biological substances: Secondary | ICD-10-CM

## 2019-05-23 DIAGNOSIS — D649 Anemia, unspecified: Secondary | ICD-10-CM

## 2019-05-23 DIAGNOSIS — Z79899 Other long term (current) drug therapy: Secondary | ICD-10-CM

## 2019-05-23 NOTE — Patient Instructions (Addendum)
Paula Garcia , Thank you for taking time to come for your Annual Wellness Visit. I appreciate your ongoing commitment to your health goals. Please review the following plan we discussed and let me know if I can assist you in the future.   These are the goals we discussed: Goals    . Weight (lb) < 180 lb (81.6 kg)       This is a list of the screening recommended for you and due dates:  Health Maintenance  Topic Date Due  . HIV Screening  06/07/1990  . Pap Smear  11/22/2018  . Flu Shot  06/23/2019  . Mammogram  07/04/2019  . Tetanus Vaccine  10/06/2021     Know what a healthy weight is for you (roughly BMI <25) and aim to maintain this  Aim for 7+ servings of fruits and vegetables daily  65-80+ fluid ounces of water or unsweet tea for healthy kidneys  Limit to max 1 drink of alcohol per day; avoid smoking/tobacco  Limit animal fats in diet for cholesterol and heart health - choose grass fed whenever available  Avoid highly processed foods, and foods high in saturated/trans fats  Aim for low stress - take time to unwind and care for your mental health  Aim for 150 min of moderate intensity exercise weekly for heart health, and weights twice weekly for bone health  Aim for 7-9 hours of sleep daily    Try tracking intake for 3-4 weeks - Lose it! Or MyFitnessPAL are free options   Make sure to measure or weigh foods for accuracy  Pay attention to calories, and carb/fat/protein breakdown - may need to decrease OR INCREASE calories, or mix up your macro ratios, or introduce low/high calorie days to jump start metabolism  Consider intermittent fasting 2-3 days a week (helps force body into ketosis and encourages fat burning while maintaining muscle mass)   General weight loss tips   Drink 1/2 your body weight in fluid ounces of water daily; drink a tall glass of water 30 min before meals  Don't eat until you're stuffed- listen to your stomach and eat until you are 80% full    Try eating off of a salad plate; wait 10 min after finishing before going back for seconds  Start by eating the vegetables on your plate; aim for 50% of your meals to be fruits or vegetables  Then eat your protein - lean meats (grass fed if possible), fish, beans, nuts in moderation  Eat your carbs/starch last ONLY if you still are hungry. If you can, stop before finishing it all  Avoid sugar and flour - the closer it looks to it's original form in nature, typically the better it is for you  Splurge in moderation - "assign" days when you get to splurge and have the "bad stuff" - I like to follow a 80% - 20% plan- "good" choices 80 % of the time, "bad" choices in moderation 20% of the time  Simple equation is: Calories out > calories in = weight loss - even if you eat the bad stuff, if you limit portions, you will still lose weight    Intermittent fasting   Intermittent fasting is more about strategy than starvation. It's meant to reset your body in different ways, hopefully with fitness and nutrition changes as a result.  Like any big switchover, though, results may vary when it comes down to the individual level. What works for your friends may not work for you,  or vice versa. That's why it's helpful to play around with variations on intermittent fasting and healthy habits and find what works best for you.  WHAT IS INTERMITTENT FASTING AND WHY DO IT?  Intermittent fasting doesn't involve specific foods, but rather, a strict schedule regarding when you eat. Also called "time-restricted eating," the tactic has been praised for its contribution to weight loss, improved body composition, and decreased cravings. Preliminary research also suggests it may be beneficial for glucose tolerance, hormone regulation, better muscle mass and lower body fat.  Part of its appeal is the simplicity of the effort. Unlike some other trends, there's no calculations to intermittent fasting.  You simply eat  within a certain block of time, usually a window of 8-10 hours. In the other big block of time - about 14-16 hours, including when you're asleep - you don't eat anything, not even snacks. You can drink water, coffee, tea or any other beverage that doesn't have calories.  For example, if you like having a late dinner, you might skip breakfast and have your first meal at noon and your last meal of the day at 8 p.m., and then not eat until noon again the next day.  IDEAS FOR GETTING STARTED  If you're new to the strategy, it may be helpful to eat within the typical circadian rhythm and keep eating within daylight hours. This can be especially beneficial if you're looking at intermittent fasting for weight-loss goals.  So first try only eating between 12pm to 8pm.  Outside of this time you may have water, black coffee, and hot tea. You may not eat it drink anything that has carbs, sugars, OR artificial sugars like diet soda.   Like any major eating and fitness shift, it can take time to find the perfect fit, so don't be afraid to experiment with different options - including ditching intermittent fasting altogether if it's simply not for you. But if it is, you may be surprised by some of the benefits that come along with the strategy.

## 2019-05-24 LAB — COMPLETE METABOLIC PANEL WITH GFR
AG Ratio: 1.8 (calc) (ref 1.0–2.5)
ALT: 19 U/L (ref 6–29)
AST: 17 U/L (ref 10–30)
Albumin: 4.5 g/dL (ref 3.6–5.1)
Alkaline phosphatase (APISO): 74 U/L (ref 31–125)
BUN: 12 mg/dL (ref 7–25)
CO2: 27 mmol/L (ref 20–32)
Calcium: 9.5 mg/dL (ref 8.6–10.2)
Chloride: 102 mmol/L (ref 98–110)
Creat: 0.78 mg/dL (ref 0.50–1.10)
GFR, Est African American: 108 mL/min/{1.73_m2} (ref >=60)
GFR, Est Non African American: 93 mL/min/{1.73_m2} (ref >=60)
Globulin: 2.5 g/dL (calc) (ref 1.9–3.7)
Glucose, Bld: 90 mg/dL (ref 65–99)
Potassium: 4 mmol/L (ref 3.5–5.3)
Sodium: 138 mmol/L (ref 135–146)
Total Bilirubin: 1.5 mg/dL — ABNORMAL HIGH (ref 0.2–1.2)
Total Protein: 7 g/dL (ref 6.1–8.1)

## 2019-05-24 LAB — URINALYSIS, ROUTINE W REFLEX MICROSCOPIC
Bilirubin Urine: NEGATIVE
Glucose, UA: NEGATIVE
Hgb urine dipstick: NEGATIVE
Ketones, ur: NEGATIVE
Leukocytes,Ua: NEGATIVE
Nitrite: NEGATIVE
Protein, ur: NEGATIVE
Specific Gravity, Urine: 1.005 (ref 1.001–1.03)
pH: 7.5 (ref 5.0–8.0)

## 2019-05-24 LAB — CBC WITH DIFFERENTIAL/PLATELET
Absolute Monocytes: 577 cells/uL (ref 200–950)
Basophils Absolute: 58 cells/uL (ref 0–200)
Basophils Relative: 0.8 %
Eosinophils Absolute: 139 cells/uL (ref 15–500)
Eosinophils Relative: 1.9 %
HCT: 41.7 % (ref 35.0–45.0)
Hemoglobin: 14.5 g/dL (ref 11.7–15.5)
Lymphs Abs: 1913 cells/uL (ref 850–3900)
MCH: 29.9 pg (ref 27.0–33.0)
MCHC: 34.8 g/dL (ref 32.0–36.0)
MCV: 86 fL (ref 80.0–100.0)
MPV: 9.9 fL (ref 7.5–12.5)
Monocytes Relative: 7.9 %
Neutro Abs: 4614 cells/uL (ref 1500–7800)
Neutrophils Relative %: 63.2 %
Platelets: 304 10*3/uL (ref 140–400)
RBC: 4.85 10*6/uL (ref 3.80–5.10)
RDW: 12.5 % (ref 11.0–15.0)
Total Lymphocyte: 26.2 %
WBC: 7.3 10*3/uL (ref 3.8–10.8)

## 2019-05-24 LAB — LIPID PANEL
Cholesterol: 155 mg/dL (ref <200)
HDL: 50 mg/dL (ref >=50)
LDL Cholesterol (Calc): 79 mg/dL (calc)
Non-HDL Cholesterol (Calc): 105 mg/dL (calc) (ref <130)
Total CHOL/HDL Ratio: 3.1 (calc) (ref <5.0)
Triglycerides: 161 mg/dL — ABNORMAL HIGH (ref <150)

## 2019-05-24 LAB — MAGNESIUM: Magnesium: 1.9 mg/dL (ref 1.5–2.5)

## 2019-05-24 LAB — HEMOGLOBIN A1C
Hgb A1c MFr Bld: 5 % of total Hgb (ref <5.7)
Mean Plasma Glucose: 97 (calc)
eAG (mmol/L): 5.4 (calc)

## 2019-05-24 LAB — TSH: TSH: 1.76 mIU/L

## 2019-05-24 LAB — VITAMIN D 25 HYDROXY (VIT D DEFICIENCY, FRACTURES): Vit D, 25-Hydroxy: 59 ng/mL (ref 30–100)

## 2019-06-27 LAB — RESULTS CONSOLE HPV: CHL HPV: NEGATIVE

## 2019-06-27 LAB — HM PAP SMEAR: HM Pap smear: NEGATIVE

## 2019-07-18 LAB — HM MAMMOGRAPHY

## 2019-07-24 ENCOUNTER — Other Ambulatory Visit: Payer: Self-pay | Admitting: Adult Health

## 2019-07-24 DIAGNOSIS — R200 Anesthesia of skin: Secondary | ICD-10-CM

## 2019-07-25 ENCOUNTER — Encounter: Payer: Self-pay | Admitting: Neurology

## 2019-07-25 ENCOUNTER — Encounter: Payer: Self-pay | Admitting: Internal Medicine

## 2019-07-26 NOTE — Progress Notes (Signed)
New Patient Virtual Visit via Video Note The purpose of this virtual visit is to provide medical care while limiting exposure to the novel coronavirus.    Consent was obtained for video visit:  Yes.   Answered questions that patient had about telehealth interaction:  Yes.   I discussed the limitations, risks, security and privacy concerns of performing an evaluation and management service by telemedicine. I also discussed with the patient that there may be a patient responsible charge related to this service. The patient expressed understanding and agreed to proceed.  Pt location: Home Physician Location: office Name of referring provider:  Liane Comber, NP I connected with Paula Garcia at patients initiation/request on 07/27/2019 at  9:10 AM EDT by video enabled telemedicine application and verified that I am speaking with the correct person using two identifiers. Pt MRN:  [id number] Pt DOB:  [birth year] Video Participants:  Paula Garcia    History of Present Illness: Paula Garcia is a 43 y.o. right-handed female with lumbar degenerative disc disease, cervical fusion at C3-4 and C5-6, and hypertriglyceridemia presenting for evaluation of bilateral leg numbness.   Starting in 2018, she began having numbness from the lower legs from the knees into the feet, which is worse on the right.  Over the past year, symptoms have become more noticeable.  It occurs when she is walking or standing, such as when washing the dishes for longer than 5 minutes.  Walking up an incline tends to her to give the symptoms more often.  Usually stretching the legs quickly alleviates symptoms.  Symptoms do not occur at rest such as when sitting or laying.  She denies any leg weakness, falls, or cramps.  She has tried acupuncture with no improvement.  She has also been regularly going to physical therapy and denies any benefit for these symptoms.Paula Garcia paper records, electronic medical record, and images  have been reviewed where available and summarized as:  Lab Results  Component Value Date   HGBA1C 5.0 05/23/2019   Lab Results  Component Value Date   VITAMINB12 397 03/27/2018   Lab Results  Component Value Date   TSH 1.76 05/23/2019    Past Medical History:  Diagnosis Date  . Allergy   . DDD (degenerative disc disease)   . Endometriosis     Past Surgical History:  Procedure Laterality Date  . CERVICAL FUSION  2012   C3-C4  C5-C6  . CESAREAN SECTION  2010  . COLPOSCOPY     age 18  . LAPAROSCOPIC ABDOMINAL EXPLORATION  2004,2005   due to endometriosis  . WISDOM TOOTH EXTRACTION       Medications:  Outpatient Encounter Medications as of 07/27/2019  Medication Sig  . cetirizine (ZYRTEC) 10 MG tablet Take 10 mg by mouth as needed for allergies.  . Cholecalciferol (VITAMIN D3 PO) Take by mouth.  . Cyanocobalamin (VITAMIN B12 PO) Take by mouth.  . Omega-3 Fatty Acids (FISH OIL PO) Take by mouth.  . Zinc 50 MG TABS Take by mouth.   No facility-administered encounter medications on file as of 07/27/2019.     Allergies: No Known Allergies  Family History: Family History  Problem Relation Age of Onset  . Cancer Mother        Skin  . Heart disease Father   . COPD Father        smoker  . Throat cancer Maternal Aunt        throat  . Stroke Maternal  Grandfather        59s  . Brain cancer Paternal Grandmother   . Congenital Murmur Son     Social History: Social History   Tobacco Use  . Smoking status: Never Smoker  . Smokeless tobacco: Never Used  Substance Use Topics  . Alcohol use: Yes    Comment: Rare/social  . Drug use: No   Social History   Social History Narrative   Two story home   One child   57 years education   Right handed    Review of Systems:  CONSTITUTIONAL: No fevers, chills, night sweats, or weight loss.   EYES: No visual changes or eye pain ENT: No hearing changes.  No history of nose bleeds.   RESPIRATORY: No cough, wheezing and  shortness of breath.   CARDIOVASCULAR: Negative for chest pain, and palpitations.   GI: Negative for abdominal discomfort, blood in stools or black stools.  No recent change in bowel habits.   GU:  No history of incontinence.   MUSCLOSKELETAL: No history of joint pain or swelling.  No myalgias.   SKIN: Negative for lesions, rash, and itching.   HEMATOLOGY/ONCOLOGY: Negative for prolonged bleeding, bruising easily, and swollen nodes.  No history of cancer.   ENDOCRINE: Negative for cold or heat intolerance, polydipsia or goiter.   PSYCH:  No depression or anxiety symptoms.   NEURO: As Above.   Vital Signs:  Ht 5\' 6"  (1.676 m)   Wt 195 lb (88.5 kg)   BMI 31.47 kg/m    General Medical Exam:  Well appearing, comfortable.  Nonlabored breathing.  No deformity or edema.  No rash.  Neurological Exam: MENTAL STATUS including orientation to time, place, person, recent and remote memory, attention span and concentration, language, and fund of knowledge is normal.  Speech is not dysarthric.  CRANIAL NERVES:  Normal conjugate, extra-ocular eye movements in all directions of gaze.  No ptosis.  Normal facial symmetry and movements.  Normal shoulder shrug and head rotation.  Tongue is midline.  MOTOR:  Antigravity in all extremities.  Muscle bulk is preserved, no focal atrophy.  No abnormal movements.  No pronator drift.   SENSORY & REFLEXES:  Unable to assess.   COORDINATION/GAIT: Normal finger to nose bilaterally.  Intact rapid alternating movements bilaterally.  Able to rise from a chair without using arms.  Easily able to perform a squat.  Gait narrow based and stable. Stressed gait intact.    IMPRESSION: Bilateral lower leg and feet paresthesias.  Symptoms may be related to L5 radiculopathy or canal stenosis at this level.  Unfortunately, she has not had any benefit with physical therapy.  I will have her return to the office for formal neuromuscular examination and electrodiagnostic testing  of the legs.   Follow Up Instructions:  I discussed the assessment and treatment plan with the patient. The patient was provided an opportunity to ask questions and all were answered. The patient agreed with the plan and demonstrated an understanding of the instructions.   The patient was advised to call back or seek an in-person evaluation if the symptoms worsen or if the condition fails to improve as anticipated.  Total Time spent:  45 min   Alda Berthold, DO

## 2019-07-27 ENCOUNTER — Other Ambulatory Visit: Payer: Self-pay

## 2019-07-27 ENCOUNTER — Telehealth (INDEPENDENT_AMBULATORY_CARE_PROVIDER_SITE_OTHER): Payer: Medicare HMO | Admitting: Neurology

## 2019-07-27 VITALS — Ht 66.0 in | Wt 195.0 lb

## 2019-07-27 DIAGNOSIS — R200 Anesthesia of skin: Secondary | ICD-10-CM

## 2019-08-21 ENCOUNTER — Ambulatory Visit (INDEPENDENT_AMBULATORY_CARE_PROVIDER_SITE_OTHER): Payer: 59 | Admitting: *Deleted

## 2019-08-21 ENCOUNTER — Other Ambulatory Visit: Payer: Self-pay

## 2019-08-21 VITALS — Temp 97.1°F

## 2019-08-21 DIAGNOSIS — Z23 Encounter for immunization: Secondary | ICD-10-CM | POA: Diagnosis not present

## 2019-09-06 ENCOUNTER — Other Ambulatory Visit: Payer: Self-pay

## 2019-09-06 ENCOUNTER — Ambulatory Visit (INDEPENDENT_AMBULATORY_CARE_PROVIDER_SITE_OTHER): Payer: 59 | Admitting: Neurology

## 2019-09-06 DIAGNOSIS — R200 Anesthesia of skin: Secondary | ICD-10-CM | POA: Diagnosis not present

## 2019-09-06 DIAGNOSIS — M5417 Radiculopathy, lumbosacral region: Secondary | ICD-10-CM | POA: Diagnosis not present

## 2019-09-06 NOTE — Procedures (Signed)
[facility]  [address], [address]  [place], [place] [address] Tel: [phone] Fax:  [phone] Test Date:  09/06/2019  Patient: Paula Garcia DOB: 1976-03-23 Physician: Narda Amber, DO  Sex: Female Height: 5\' 6"  Ref Phys: Narda Amber, DO  ID#: [id number] Temp: 32.0C Technician:    Patient Complaints: This is a 43 year old female referred for evaluation of bilateral leg and feet numbness.  NCV & EMG Findings: Extensive electrodiagnostic testing of the right lower extremity and additional studies of the left shows:  1. Bilateral sural and superficial peroneal sensory responses are within normal limits. 2. Bilateral peroneal and tibial motor responses are within normal limits. 3. Bilateral tibial H reflex studies are within normal limits. 4. Chronic motor axonal loss changes are seen affecting the S1 myotomes bilaterally, without accompanied active denervation.  Impression: 1. Chronic S1 radiculopathy affecting bilateral lower extremities, mild. 2. There is no evidence of a sensorimotor polyneuropathy affecting the lower extremities.   ___________________________ Narda Amber, DO    Nerve Conduction Studies Anti Sensory Summary Table   Site NR Peak (ms) Norm Peak (ms) P-T Amp (V) Norm P-T Amp  Left Sup Peroneal Anti Sensory (Ant Lat Mall)  32C  12 cm    2.2 <4.5 6.5 >5  Right Sup Peroneal Anti Sensory (Ant Lat Mall)  32C  12 cm    2.3 <4.5 8.5 >5  Left Sural Anti Sensory (Lat Mall)  32C  Calf    3.2 <4.5 7.5 >5  Right Sural Anti Sensory (Lat Mall)  32C  Calf    3.3 <4.5 7.6 >5   Motor Summary Table   Site NR Onset (ms) Norm Onset (ms) O-P Amp (mV) Norm O-P Amp Site1 Site2 Delta-0 (ms) Dist (cm) Vel (m/s) Norm Vel (m/s)  Left Peroneal Motor (Ext Dig Brev)  32C  Ankle    2.8 <5.5 3.2 >3 B Fib Ankle 8.4 35.0 42 >40  B Fib    11.2  2.9  Poplt B Fib 1.4 7.0 50 >40  Poplt    12.6  2.9         Right Peroneal Motor (Ext Dig Brev)  32C  Ankle     2.8 <5.5 3.6 >3 B Fib Ankle 7.4 37.0 50 >40  B Fib    10.2  3.6  Poplt B Fib 1.4 8.0 57 >40  Poplt    11.6  3.4         Left Tibial Motor (Abd Hall Brev)  32C  Ankle    3.0 <6.0 8.3 >8 Knee Ankle 8.1 42.0 52 >40  Knee    11.1  6.7         Right Tibial Motor (Abd Hall Brev)  32C  Ankle    5.2 <6.0 9.8 >8 Knee Ankle 8.7 42.0 48 >40  Knee    13.9  7.7          H Reflex Studies   NR H-Lat (ms) Lat Norm (ms) L-R H-Lat (ms)  Left Tibial (Gastroc)  32C     32.65 <35 1.23  Right Tibial (Gastroc)  32C     33.88 <35 1.23   EMG   Side Muscle Ins Act Fibs Psw Fasc Number Recrt Dur Dur. Amp Amp. Poly Poly. Comment  Right AntTibialis Nml Nml Nml Nml Nml Nml Nml Nml Nml Nml Nml Nml N/A  Right Gastroc Nml Nml Nml Nml 1- Rapid Some 1+ Some 1+ Nml Nml N/A  Right Flex Dig Long Nml Nml  Nml Nml Nml Nml Nml Nml Nml Nml Nml Nml N/A  Right RectFemoris Nml Nml Nml Nml Nml Nml Nml Nml Nml Nml Nml Nml N/A  Right GluteusMed Nml Nml Nml Nml Nml Nml Nml Nml Nml Nml Nml Nml N/A  Right BicepsFemS Nml Nml Nml Nml 1- Rapid Some 1+ Some 1+ Nml Nml N/A  Left AntTibialis Nml Nml Nml Nml Nml Nml Nml Nml Nml Nml Nml Nml N/A  Left Gastroc Nml Nml Nml Nml 1- Rapid Few 1+ Few 1+ Nml Nml N/A  Left Flex Dig Long Nml Nml Nml Nml Nml Nml Nml Nml Nml Nml Nml Nml N/A  Left RectFemoris Nml Nml Nml Nml Nml Nml Nml Nml Nml Nml Nml Nml N/A  Left GluteusMed Nml Nml Nml Nml Nml Nml Nml Nml Nml Nml Nml Nml N/A  Left BicepsFemS Nml Nml Nml Nml 1- Rapid Some 1+ Some 1+ Nml Nml N/A      Waveforms:

## 2019-09-06 NOTE — Progress Notes (Signed)
Follow-up Visit   Date: 09/06/19   Paula Garcia MRN: [id number] DOB: 1976/09/28   Interim History: Paula Garcia is a 43 y.o. right-handed Caucasian female with lumbar degenerative disc disease, cervica fusion at C3-4 and C5-6, and hyperlipidemia returning to the clinic for follow-up of bilateral leg numbness.  The patient was accompanied to the clinic by self.    History of present illness: Starting in 2018, she began having numbness from the lower legs from the knees into the feet, which is worse on the right.  Over the past year, symptoms have become more noticeable.  It occurs when she is walking or standing, such as when washing the dishes for longer than 5 minutes.  Walking up an incline tends to her to give the symptoms more often.  Usually stretching the legs quickly alleviates symptoms.  Symptoms do not occur at rest such as when sitting or laying.  She denies any leg weakness, falls, or cramps.  She has tried acupuncture with no improvement.  She has also been regularly going to physical therapy and denies any benefit for these symptoms.Marland Kitchen  UPDATE 09/06/2019:  She is here for follow-up and EDX of the legs.  She continues to have intermittent numbness over the back of her legs.  She also complains of cramps which is worse at night time.  She admits to not being compliant with PT lately.  It did help her low back, but no change to numbness.  She denies leg weakness.   Medications:  Current Outpatient Medications on File Prior to Visit  Medication Sig Dispense Refill  . cetirizine (ZYRTEC) 10 MG tablet Take 10 mg by mouth as needed for allergies.    . Cholecalciferol (VITAMIN D3 PO) Take by mouth.    . Cyanocobalamin (VITAMIN B12 PO) Take by mouth.    . Omega-3 Fatty Acids (FISH OIL PO) Take by mouth.    . Zinc 50 MG TABS Take by mouth.     No current facility-administered medications on file prior to visit.     Allergies: No Known Allergies  Review of Systems:   CONSTITUTIONAL: No fevers, chills, night sweats, or weight loss.  EYES: No visual changes or eye pain ENT: No hearing changes.  No history of nose bleeds.   RESPIRATORY: No cough, wheezing and shortness of breath.   CARDIOVASCULAR: Negative for chest pain, and palpitations.   GI: Negative for abdominal discomfort, blood in stools or black stools.  No recent change in bowel habits.   GU:  No history of incontinence.   MUSCLOSKELETAL: No history of joint pain or swelling.  No myalgias.   SKIN: Negative for lesions, rash, and itching.   ENDOCRINE: Negative for cold or heat intolerance, polydipsia or goiter.   PSYCH:  No depression or anxiety symptoms.   NEURO: As Above.   Neurological Exam: MENTAL STATUS including orientation to time, place, person, recent and remote memory, attention span and concentration, language, and fund of knowledge is normal.  Speech is not dysarthric.  CRANIAL NERVES:  Pupils equal round and reactive to light.  Normal conjugate, extra-ocular eye movements in all directions of gaze.  No ptosis   MOTOR:  Motor strength is 5/5 in all extremities.  No atrophy, fasciculations or abnormal movements.  No pronator drift.  Tone is normal.    MSRs:  Reflexes are 2+/4 throughout, except mildly reduced at bilateral ankles.  SENSORY:  Intact to pin prick throughout.  COORDINATION/GAIT:  Normal finger-to- nose-finger.  Intact rapid alternating movements bilaterally.  Gait narrow based and stable.   Data: NCS/EMG of the legs 09/06/2019:  Chronic S1 radiculopathy affecting bilateral lower extremities, mild.  IMPRESSION/PLAN: Bilateral leg numbness, possibly due to mild S1 radiculopathy.   - Continue PT for low back strengthening  - If no improvement, next step will be MRI lumbar spine  Return to clinic in 3 months.    Thank you for allowing me to participate in patient's care.  If I can answer any additional questions, I would be pleased to do so.    Sincerely,     Axavier Pressley K. Posey Pronto, DO

## 2019-11-13 ENCOUNTER — Encounter: Payer: Self-pay | Admitting: Neurology

## 2019-11-23 DIAGNOSIS — U071 COVID-19: Secondary | ICD-10-CM

## 2019-11-23 HISTORY — DX: COVID-19: U07.1

## 2019-12-07 ENCOUNTER — Other Ambulatory Visit: Payer: 59

## 2019-12-07 ENCOUNTER — Ambulatory Visit: Payer: Managed Care, Other (non HMO) | Attending: Internal Medicine

## 2019-12-07 ENCOUNTER — Other Ambulatory Visit: Payer: Managed Care, Other (non HMO)

## 2019-12-07 ENCOUNTER — Ambulatory Visit: Payer: 59 | Admitting: Neurology

## 2019-12-07 DIAGNOSIS — Z20822 Contact with and (suspected) exposure to covid-19: Secondary | ICD-10-CM

## 2019-12-08 LAB — NOVEL CORONAVIRUS, NAA: SARS-CoV-2, NAA: NOT DETECTED

## 2020-03-25 ENCOUNTER — Ambulatory Visit (INDEPENDENT_AMBULATORY_CARE_PROVIDER_SITE_OTHER): Payer: 59 | Admitting: Adult Health Nurse Practitioner

## 2020-03-25 ENCOUNTER — Other Ambulatory Visit: Payer: Self-pay

## 2020-03-25 ENCOUNTER — Encounter: Payer: Self-pay | Admitting: Adult Health Nurse Practitioner

## 2020-03-25 VITALS — BP 122/80 | HR 68 | Temp 98.1°F | Wt 193.0 lb

## 2020-03-25 DIAGNOSIS — B373 Candidiasis of vulva and vagina: Secondary | ICD-10-CM

## 2020-03-25 DIAGNOSIS — H6692 Otitis media, unspecified, left ear: Secondary | ICD-10-CM | POA: Diagnosis not present

## 2020-03-25 DIAGNOSIS — J029 Acute pharyngitis, unspecified: Secondary | ICD-10-CM | POA: Diagnosis not present

## 2020-03-25 DIAGNOSIS — B3731 Acute candidiasis of vulva and vagina: Secondary | ICD-10-CM

## 2020-03-25 MED ORDER — FLUCONAZOLE 150 MG PO TABS: ORAL_TABLET | ORAL | 0 refills | Status: DC

## 2020-03-25 MED ORDER — AZITHROMYCIN 250 MG PO TABS: ORAL_TABLET | ORAL | 1 refills | Status: AC

## 2020-03-25 NOTE — Patient Instructions (Signed)
  Take an antihistamine daily to help dry up the fluid behind your ears.  Zyrtec (cetiriazine) Xyxzal (levocitriazine) Allegra (Fexofenadine)  Pic one, and change if you feel it is not drying you out.  We have sent in Azithromycin, take two tablets today and one tablet daily until complete.  You can take ibuprofen 800mg  every 8 hours OR 600mg  every 6 hours to help with the inflammation.  Be sure to take this with food.    Otitis Media, Adult  Otitis media means that the middle ear is red and swollen (inflamed) and full of fluid. The condition usually goes away on its own. Follow these instructions at home:  Take over-the-counter and prescription medicines only as told by your doctor.  If you were prescribed an antibiotic medicine, take it as told by your doctor. Do not stop taking the antibiotic even if you start to feel better.  Keep all follow-up visits as told by your doctor. This is important. Contact a doctor if:  You have bleeding from your nose.  There is a lump on your neck.  You are not getting better in 5 days.  You feel worse instead of better. Get help right away if:  You have pain that is not helped with medicine.  You have swelling, redness, or pain around your ear.  You get a stiff neck.  You cannot move part of your face (paralyzed).  You notice that the bone behind your ear hurts when you touch it.  You get a very bad headache. Summary  Otitis media means that the middle ear is red, swollen, and full of fluid.  This condition usually goes away on its own. In some cases, treatment may be needed.  If you were prescribed an antibiotic medicine, take it as told by your doctor. This information is not intended to replace advice given to you by your health care provider. Make sure you discuss any questions you have with your health care provider. Document Revised: 10/21/2017 Document Reviewed: 11/29/2016 Elsevier Patient Education  2020 [employer].

## 2020-03-25 NOTE — Progress Notes (Signed)
   Assessment and Plan:  Paula Garcia was seen today for ear fullness and sore throat.  Diagnoses and all orders for this visit:  Acute otitis media, left -     azithromycin (ZITHROMAX) 250 MG tablet; Take 2 tablets (500 mg) on  Day 1,  followed by 1 tablet (250 mg) once daily on Days 2 through 5. Discussed antihistamine to help with ears and throat.  Vaginal candida -     fluconazole (DIFLUCAN) 150 MG tablet; Take one tablet by mouth at onset of yeast symptoms.  Take second tablet on day three.  Sore throat Gargle salt water and spit out May use OTC throat spray. Warm / cold liquids Use OTC lozenges or spray Increase water intake  Call or return with new or worsening symptoms  Further disposition pending results of labs. Discussed med's effects and SE's.   Over 30 minutes of face to face interview, exam, counseling, chart review, and critical decision making was performed.   Future Appointments  Date Time Provider [place]  03/25/2020  2:30 PM Garnet Sierras, NP [facility] None  05/29/2020  9:00 AM Liane Comber, NP [facility] None    ------------------------------------------------------------------------------------------------------------------   HPI 44 y.o.female presents for evaluation symptoms that started about one week ago.  She reports her left ear feels full and painful.  Reports sore that that seems to just be on the left side.  She has tried ibuprofen and salt water gargles.  She has also done a NeilMed nasal rinse.  She reports she feels fine other wise but the symptoms are lingered.  Denies any fevers, popping or cracking in her ears.  Today is two weeks s/p second vaccination. Reports she did travel last week via [employer].  Past Medical History:  Diagnosis Date  . Allergy   . DDD (degenerative disc disease)   . Endometriosis      No Known Allergies  Current Outpatient Medications on File Prior to Visit  Medication Sig  . cetirizine (ZYRTEC) 10  MG tablet Take 10 mg by mouth as needed for allergies.  . Cholecalciferol (VITAMIN D3 PO) Take by mouth.  . Cyanocobalamin (VITAMIN B12 PO) Take by mouth.  . Omega-3 Fatty Acids (FISH OIL PO) Take by mouth.  . Zinc 50 MG TABS Take by mouth.   No current facility-administered medications on file prior to visit.    ROS: all negative except above.   Physical Exam:  There were no vitals taken for this visit.  General Appearance: Well nourished, in no apparent distress. Eyes: PERRLA, EOMs, conjunctiva no swelling or erythema Sinuses: No Frontal/maxillary tenderness ENT/Mouth: Ext aud canals clear bilateral, TMs without erythema, buldging Left TM retracted, cloudy exudated noted behind TM. No swelling, or exudate on post pharynx, mild erythema noted on pharnyx.  Tonsils not swollen or erythematous. Hearing normal.  Neck: Supple, thyroid normal.  Respiratory: Respiratory effort normal, BS equal bilaterally without rales, rhonchi, wheezing or stridor.  Cardio: RRR with no MRGs. Brisk peripheral pulses without edema.  Abdomen: Soft, + BS.  Non tender, no guarding, rebound, hernias, masses. Lymphatics: Non tender without lymphadenopathy.  Musculoskeletal: Full ROM, 5/5 strength, normal gait.  Skin: Warm, dry without rashes, lesions, ecchymosis.  Neuro: Cranial nerves intact. Normal muscle tone, no cerebellar symptoms. Sensation intact.  Psych: Awake and oriented X 3, normal affect, Insight and Judgment appropriate.     Garnet Sierras, NP 3:27 PM [facility]

## 2020-03-31 ENCOUNTER — Encounter: Payer: 59 | Admitting: Adult Health

## 2020-05-27 DIAGNOSIS — E559 Vitamin D deficiency, unspecified: Secondary | ICD-10-CM | POA: Insufficient documentation

## 2020-05-27 NOTE — Progress Notes (Signed)
Complete Physical  Assessment and Plan:  Paula Garcia was seen today for annual exam.  Diagnoses and all orders for this visit:  Routine general medical examination at a health care facility  DDD (degenerative disc disease), lumbar Symptoms well managed at this time, intermittent, mild Has after running occasionally Uses OTC analgesics rarely  Class 1 obesity due to excess calories without serious comorbidity in adult, unspecified BMI Long discussion about weight loss, diet, and exercise Recommended diet heavy in fruits and veggies and low in animal meats, cheeses, and dairy products, appropriate calorie intake Discussed appropriate weight for height  Follow up at next visit  Dysplastic nevus Remote, established with dermatology - needs to follow up, she will schedule  Allergic state, subsequent encounter OTC allergy meds PRN  Medication management -     CBC with Differential/Platelet -     COMPLETE METABOLIC PANEL WITH GFR -     Urinalysis w microscopic  -     Magnesium   Hypertriglyceridemia Mild; lifestyle modification only  Continue low cholesterol diet and exercise.  Check lipid panel.  -     Lipid panel -     TSH  Vitamin D deficiency -     VITAMIN D 25 Hydroxy (Vit-D Deficiency, Fractures)  B12 deficiency Check levels, on supplement   Melasma Left cheek; discussed treatment options; she will try OTC topical niacinamide and azelaic acids. Consider hydroquinone if inadequate response. Emphasized daily SPF 30+.   Discussed med's effects and SE's. Screening labs and tests as requested with regular follow-up as recommended. Over 40 minutes of exam, counseling, chart review, and complex, high level critical decision making was performed this visit.   Future Appointments  Date Time Provider [place]  06/01/2021  9:00 AM Liane Comber, NP [facility] None    HPI  44 y.o. Caucasian female  presents for a complete physical and follow up. She has  Environmental allergies; DDD (degenerative disc disease), lumbar; Obesity (BMI 30.0-34.9); History of dysplastic nevus; and Vitamin D deficiency on their problem list.   She is married, works in [employer] (back in office 1 day a week), 1 child, boy - 10  She has no complaints at this time.  Sees OBGYN Dr. Willis Modena annually.   This past year reported bil leg numbness, hx of DDD, saw neuro Dr. Posey Pronto who found S1 radiculopathy by nerve conduction study, did PT and now manages well with stretches at home PRN.    BMI is Body mass index is 31.47 kg/m., she has been working on diet and exercise.  Diet - pushing veggies/fruit, frequent beans, minimal red meat, some fish and chicken, will do whole wheat wrap  She estimates water intake between 50 fluid ounces.  She reports she drinks 3 cups of coffee daily. Very rarely drinks soda.  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  Was drinking more over the pandemic, 1 up to 5 days a week, now back to  Wt Readings from Last 3 Encounters:  05/29/20 195 lb (88.5 kg)  03/25/20 193 lb (87.5 kg)  07/25/19 195 lb (88.5 kg)   Today their BP is BP: 106/70 She does workout. She denies chest pain, shortness of breath, dizziness.    The cholesterol last visit was:   Lab Results  Component Value Date   CHOL 155 05/23/2019   HDL 50 05/23/2019   LDLCALC 79 05/23/2019   TRIG 161 (H) 05/23/2019   CHOLHDL 3.1 05/23/2019    Last A1C  in the office was:  Lab Results  Component Value Date   HGBA1C 5.0 05/23/2019   Last GFR: Lab Results  Component Value Date   GFRNONAA 93 05/23/2019   Patient is currently on Vitamin D supplement, unsure of dose   Lab Results  Component Value Date   VD25OH 63 05/23/2019     She is on b12 supplement Lab Results  Component Value Date   YDXAJOIN86 767 03/27/2018     Current Medications:  Current Outpatient Medications on File Prior to Visit  Medication Sig Dispense Refill  . cetirizine  (ZYRTEC) 10 MG tablet Take 10 mg by mouth as needed for allergies.    . Cholecalciferol (VITAMIN D3 PO) Take by mouth.    . Cyanocobalamin (VITAMIN B12 PO) Take by mouth.    . fluconazole (DIFLUCAN) 150 MG tablet Take one tablet by mouth at onset of yeast symptoms.  Take second tablet on day three. 2 tablet 0  . Omega-3 Fatty Acids (FISH OIL PO) Take by mouth.    . Zinc 50 MG TABS Take by mouth.     No current facility-administered medications on file prior to visit.   Allergies:  No Known Allergies Medical History:  She has Environmental allergies; DDD (degenerative disc disease), lumbar; Obesity (BMI 30.0-34.9); History of dysplastic nevus; and Vitamin D deficiency on their problem list. Health Maintenance:   Immunization History  Administered Date(s) Administered  . Influenza Inj Mdck Quad With Preservative 09/06/2018, 08/21/2019  . Tdap 10/07/2011   Tetanus: 2012 Pneumovax: N/A Prevnar 13: N/A Flu vaccine: 07/2019 Zostavax: N/A  Covid 19: 2/2, 2021   LMP: Patient's last menstrual period was 05/06/2020. Pap: 2020, has q3y by GYN, due mirena switch 2022 3DMGM: 06/2019  DEXA: N/A Colonoscopy: N/A EGD: N/A  Last Dental Exam: 2021, q6 months, has scheduled Last Eye Exam: Remote, wears readers, no concerns Last Derm Exam: Never, history of dysplastic nevus of back   Patient Care Team: Unk Pinto, MD as PCP - General (Internal Medicine)  Surgical History:  She has a past surgical history that includes Cervical fusion (2012); Colposcopy; Cesarean section (2010); Wisdom tooth extraction; and Laparoscopic abdominal exploration (2094,7096). Family History:  Herfamily history includes Brain cancer in her paternal grandmother; COPD in her father; Cancer in her mother; Congenital Murmur in her son; Heart disease in her father; Stroke in her maternal grandfather; Throat cancer in her maternal aunt. Social History:  She reports that she has never smoked. She has never used  smokeless tobacco. She reports current alcohol use of about 3.0 standard drinks of alcohol per week. She reports that she does not use drugs.  Review of Systems: Review of Systems  Constitutional: Negative for malaise/fatigue and weight loss.  HENT: Negative for hearing loss and tinnitus.   Eyes: Negative for blurred vision and double vision.  Respiratory: Negative for cough, shortness of breath and wheezing.   Cardiovascular: Negative for chest pain, palpitations, orthopnea, claudication and leg swelling.  Gastrointestinal: Negative for abdominal pain, blood in stool, constipation, diarrhea, heartburn, melena, nausea and vomiting.  Genitourinary: Negative.   Musculoskeletal: Negative for joint pain and myalgias.  Skin: Negative for rash.  Neurological: Negative for dizziness, tingling, sensory change, weakness and headaches.  Endo/Heme/Allergies: Negative for polydipsia.  Psychiatric/Behavioral: Negative.   All other systems reviewed and are negative.   Physical Exam: Estimated body mass index is 31.47 kg/m as calculated from the following:   Height as of this encounter: 5\' 6"  (1.676 m).   Weight as  of this encounter: 195 lb (88.5 kg). BP 106/70   Pulse 75   Temp (!) 96.6 F (35.9 C)   Ht 5\' 6"  (1.676 m)   Wt 195 lb (88.5 kg)   LMP 05/06/2020   SpO2 96%   BMI 31.47 kg/m  General Appearance: Well nourished, in no apparent distress.  Eyes: PERRLA, EOMs, conjunctiva no swelling or erythema Sinuses: No Frontal/maxillary tenderness  ENT/Mouth: Ext aud canals clear, normal light reflex with TMs without erythema, bulging. Good dentition. No erythema, swelling, or exudate on post pharynx. Tonsils not swollen or erythematous. Hearing normal.  Neck: Supple, thyroid normal. No bruits  Respiratory: Respiratory effort normal, BS equal bilaterally without rales, rhonchi, wheezing or stridor.  Cardio: RRR without murmurs, rubs or gallops. Brisk peripheral pulses without edema.  Chest:  symmetric, with normal excursions and percussion.  Breasts: Defer to GYN  Abdomen: Soft, nontender, no guarding, rebound, hernias, masses, or organomegaly.  Lymphatics: Non tender without lymphadenopathy.  Genitourinary: Defer to GYN Musculoskeletal: Full ROM all peripheral extremities,5/5 strength, and normal gait.  Skin: Warm, dry without rashes, ecchymosis. She has numerous small nevi, flat, <6 mm; two to back with irregular borders, unchanged from last year. Flat brown discoloration approx 2 cm area to left cheek.  Neuro: Cranial nerves intact, reflexes equal bilaterally. Normal muscle tone, no cerebellar symptoms. Sensation intact.  Psych: Awake and oriented X 3, normal affect, Insight and Judgment appropriate.   EKG: WNL no ST changes in 2019, no concerns, low risk, defer   Izora Ribas 9:18 AM [facility]

## 2020-05-29 ENCOUNTER — Ambulatory Visit (INDEPENDENT_AMBULATORY_CARE_PROVIDER_SITE_OTHER): Payer: 59 | Admitting: Adult Health

## 2020-05-29 ENCOUNTER — Encounter: Payer: Self-pay | Admitting: Adult Health

## 2020-05-29 ENCOUNTER — Other Ambulatory Visit: Payer: Self-pay

## 2020-05-29 VITALS — BP 106/70 | HR 75 | Temp 96.6°F | Ht 66.0 in | Wt 195.0 lb

## 2020-05-29 DIAGNOSIS — Z0000 Encounter for general adult medical examination without abnormal findings: Secondary | ICD-10-CM | POA: Diagnosis not present

## 2020-05-29 DIAGNOSIS — E559 Vitamin D deficiency, unspecified: Secondary | ICD-10-CM

## 2020-05-29 DIAGNOSIS — E781 Pure hyperglyceridemia: Secondary | ICD-10-CM

## 2020-05-29 DIAGNOSIS — Z86018 Personal history of other benign neoplasm: Secondary | ICD-10-CM

## 2020-05-29 DIAGNOSIS — Z79899 Other long term (current) drug therapy: Secondary | ICD-10-CM

## 2020-05-29 DIAGNOSIS — L811 Chloasma: Secondary | ICD-10-CM | POA: Insufficient documentation

## 2020-05-29 DIAGNOSIS — Z1329 Encounter for screening for other suspected endocrine disorder: Secondary | ICD-10-CM

## 2020-05-29 DIAGNOSIS — E538 Deficiency of other specified B group vitamins: Secondary | ICD-10-CM | POA: Insufficient documentation

## 2020-05-29 DIAGNOSIS — Z131 Encounter for screening for diabetes mellitus: Secondary | ICD-10-CM

## 2020-05-29 DIAGNOSIS — Z9109 Other allergy status, other than to drugs and biological substances: Secondary | ICD-10-CM

## 2020-05-29 DIAGNOSIS — Z0001 Encounter for general adult medical examination with abnormal findings: Secondary | ICD-10-CM

## 2020-05-29 DIAGNOSIS — E669 Obesity, unspecified: Secondary | ICD-10-CM

## 2020-05-29 DIAGNOSIS — M5136 Other intervertebral disc degeneration, lumbar region: Secondary | ICD-10-CM

## 2020-05-29 NOTE — Patient Instructions (Addendum)
Paula Garcia , Thank you for taking time to come for your Annual Wellness Visit. I appreciate your ongoing commitment to your health goals. Please review the following plan we discussed and let me know if I can assist you in the future.   These are the goals we discussed: Goals    . DIET - EAT MORE FRUITS AND VEGETABLES    . Weight (lb) < 180 lb (81.6 kg)       This is a list of the screening recommended for you and due dates:  Health Maintenance  Topic Date Due  .  Hepatitis C: One time screening is recommended by Center for Disease Control  (CDC) for  adults born from [birth year] through 1966.   Never done  . COVID-19 Vaccine (1) Never done  . HIV Screening  Never done  . Pap Smear  11/22/2018  . Flu Shot  06/22/2020  . Mammogram  07/17/2020  . Tetanus Vaccine  10/06/2021     Know what a healthy weight is for you (roughly BMI <25) and aim to maintain this  Aim for 7+ servings of fruits and vegetables daily  65-80+ fluid ounces of water or unsweet tea for healthy kidneys  Limit to max 1 drink of alcohol per day; avoid smoking/tobacco  Limit animal fats in diet for cholesterol and heart health - choose grass fed whenever available  Avoid highly processed foods, and foods high in saturated/trans fats  Aim for low stress - take time to unwind and care for your mental health  Aim for 150 min of moderate intensity exercise weekly for heart health, and weights twice weekly for bone health  Aim for 7-9 hours of sleep daily     Recommend trying azelaic acid topical and/or niacinamide    Start slow and gradually work up on frequency- both can cause some irritation until skin adjusts  Might try azelaic acid at night, niacinamide in the morning  Always wear SPF 30+ sunscreen during the day, particularly while using topical active ingredients  Should see improvement in 8-12 weeks   Melasma Melasma is a skin condition that causes areas of darker coloring. It usually appears in  patches on the cheeks, forehead, upper lip, and neck. These patches can look like a mask. The discolored areas do not itch and are not red or swollen. Melasma is not contagious. This means that it does not spread from person to person. What are the causes? The cause of this condition is not known. However, it can be started by certain triggers, such as:  Being out in the sun.  Allergies to medicines or cosmetics.  Changes in your hormones, such as: ? Taking birth control medicines. ? Taking hormone replacement therapy. ? Being pregnant. What increases the risk? The following factors may make you more likely to develop this condition:  Being a woman. Melasma is less common in men.  Having a family history of melasma.  Having darker skin.  Living in a tropical climate. What are the signs or symptoms? The only symptom of this condition is dark or tan patches on the skin. How is this diagnosed? This condition is diagnosed based on:  A physical exam. Your health care provider will examine the physical appearance of your skin. He or she may use an ultraviolet light, called a Wood lamp, to look more closely at your skin.  Biopsy. A small sample of your skin is taken and examined under a microscope. This is done to make sure  your melasma is not caused by another skin condition, such as skin cancer. How is this treated? There is no cure for this condition. However, there are treatments that may lighten the color of the darker patches. Treatment may include:  Medicines, such as bleaching or steroid creams.  Facial or chemical peels.  Laser treatment.  Dermabrasion or microdermabrasion. These procedures use fine instruments to scrape and remove the outer layer of skin in order to grow new, healthy-looking skin. Your melasma may also go away on its own over time. Follow these instructions at home:  Lifestyle  Avoid overexposure to the sun, especially in tropical areas.  Wear  sunscreen with an SPF of 30 or higher every day.  Wear a hat that protects your face from the sun.  Use gentle cosmetics that are meant for sensitive skin.  Do not use wax to remove excess hair in areas where you have or have had melasma. General instructions  Take or apply over-the-counter and prescription medicines only as told by your health care provider.  Keep all follow-up visits as told by your health care provider. This is important. Contact a health care provider if:  You have new symptoms.  Your symptoms get worse.  Your affected skin areas are: ? Bleeding. ? Irritated. Summary  Melasma is a skin condition that causes areas of darker coloring that do not itch and are not red or swollen.  The cause of this condition is not known. However, it can be started by certain triggers such as sun exposure, allergies to medicines or cosmetics, or changes in your hormones.  Risk factors include being a woman, having a family history of melasma, having darker skin, or living in a tropical climate.  There is no cure for this condition. However, there are treatments that may lighten the color of the darker patches. They include medicine, facial or chemical peels, laser treatment, dermabrasion, or microdermabrasion. This information is not intended to replace advice given to you by your health care provider. Make sure you discuss any questions you have with your health care provider. Document Revised: 11/21/2017 Document Reviewed: 11/21/2017 Elsevier Patient Education  2020 [employer].

## 2020-05-30 LAB — LIPID PANEL
Cholesterol: 165 mg/dL (ref <200)
HDL: 51 mg/dL (ref >=50)
LDL Cholesterol (Calc): 93 mg/dL (calc)
Non-HDL Cholesterol (Calc): 114 mg/dL (calc) (ref <130)
Total CHOL/HDL Ratio: 3.2 (calc) (ref <5.0)
Triglycerides: 115 mg/dL (ref <150)

## 2020-05-30 LAB — URINALYSIS, ROUTINE W REFLEX MICROSCOPIC
Bacteria, UA: NONE SEEN /HPF
Bilirubin Urine: NEGATIVE
Glucose, UA: NEGATIVE
Hgb urine dipstick: NEGATIVE
Hyaline Cast: NONE SEEN /LPF
Ketones, ur: NEGATIVE
Nitrite: NEGATIVE
Protein, ur: NEGATIVE
RBC / HPF: NONE SEEN /HPF (ref 0–2)
Specific Gravity, Urine: 1.007 (ref 1.001–1.03)
Squamous Epithelial / HPF: NONE SEEN /HPF (ref <=5)
WBC, UA: NONE SEEN /HPF (ref 0–5)
pH: 7.5 (ref 5.0–8.0)

## 2020-05-30 LAB — CBC WITH DIFFERENTIAL/PLATELET
Absolute Monocytes: 510 cells/uL (ref 200–950)
Basophils Absolute: 41 cells/uL (ref 0–200)
Basophils Relative: 0.8 %
Eosinophils Absolute: 158 cells/uL (ref 15–500)
Eosinophils Relative: 3.1 %
HCT: 40.1 % (ref 35.0–45.0)
Hemoglobin: 13.4 g/dL (ref 11.7–15.5)
Lymphs Abs: 1719 cells/uL (ref 850–3900)
MCH: 28.5 pg (ref 27.0–33.0)
MCHC: 33.4 g/dL (ref 32.0–36.0)
MCV: 85.1 fL (ref 80.0–100.0)
MPV: 10.4 fL (ref 7.5–12.5)
Monocytes Relative: 10 %
Neutro Abs: 2672 cells/uL (ref 1500–7800)
Neutrophils Relative %: 52.4 %
Platelets: 252 10*3/uL (ref 140–400)
RBC: 4.71 10*6/uL (ref 3.80–5.10)
RDW: 13 % (ref 11.0–15.0)
Total Lymphocyte: 33.7 %
WBC: 5.1 10*3/uL (ref 3.8–10.8)

## 2020-05-30 LAB — MAGNESIUM: Magnesium: 1.9 mg/dL (ref 1.5–2.5)

## 2020-05-30 LAB — COMPLETE METABOLIC PANEL WITH GFR
AG Ratio: 1.7 (calc) (ref 1.0–2.5)
ALT: 22 U/L (ref 6–29)
AST: 28 U/L (ref 10–30)
Albumin: 4.3 g/dL (ref 3.6–5.1)
Alkaline phosphatase (APISO): 65 U/L (ref 31–125)
BUN: 10 mg/dL (ref 7–25)
CO2: 27 mmol/L (ref 20–32)
Calcium: 9.3 mg/dL (ref 8.6–10.2)
Chloride: 104 mmol/L (ref 98–110)
Creat: 0.65 mg/dL (ref 0.50–1.10)
GFR, Est African American: 125 mL/min/{1.73_m2} (ref >=60)
GFR, Est Non African American: 108 mL/min/{1.73_m2} (ref >=60)
Globulin: 2.5 g/dL (calc) (ref 1.9–3.7)
Glucose, Bld: 96 mg/dL (ref 65–99)
Potassium: 4.7 mmol/L (ref 3.5–5.3)
Sodium: 139 mmol/L (ref 135–146)
Total Bilirubin: 1.8 mg/dL — ABNORMAL HIGH (ref 0.2–1.2)
Total Protein: 6.8 g/dL (ref 6.1–8.1)

## 2020-05-30 LAB — TSH: TSH: 2.12 mIU/L

## 2020-05-30 LAB — VITAMIN B12: Vitamin B-12: 549 pg/mL (ref 200–1100)

## 2020-05-30 LAB — VITAMIN D 25 HYDROXY (VIT D DEFICIENCY, FRACTURES): Vit D, 25-Hydroxy: 63 ng/mL (ref 30–100)

## 2020-08-18 LAB — HM MAMMOGRAPHY

## 2020-08-20 ENCOUNTER — Encounter: Payer: Self-pay | Admitting: *Deleted

## 2020-08-21 ENCOUNTER — Encounter (INDEPENDENT_AMBULATORY_CARE_PROVIDER_SITE_OTHER): Payer: 59

## 2020-08-21 DIAGNOSIS — Z20822 Contact with and (suspected) exposure to covid-19: Secondary | ICD-10-CM

## 2020-08-21 MED ORDER — PREDNISONE 20 MG PO TABS
ORAL_TABLET | ORAL | 0 refills | Status: DC
Start: 2020-08-21 — End: 2021-10-07

## 2020-08-21 NOTE — Telephone Encounter (Signed)
THIS ENCOUNTER IS A VIRTUAL VISIT DUE TO COVID-19 - PATIENT WAS NOT SEEN IN THE OFFICE. PATIENT HAS CONSENTED TO VIRTUAL VISIT / TELEMEDICINE VISIT   Virtual Visit via telephone Note  I connected with Paula Garcia on 08/21/2020 by telephone.  I verified that I am speaking with the correct person using two identifiers.    I discussed the limitations of evaluation and management by telemedicine and the availability of in person appointments. The patient expressed understanding and agreed to proceed.  History of Present Illness:  44 y.o. patient with hx of allergies contacted office due to URI sx suspect for covid 19.  This patient was vaccinated for covid 19, 2/2, pfizer 5-6 months ago. Her sx began after he son developed URI and tested + for covid 19 a few weeks ago. She did not get tested, presumed covid 19 and has been isolating at home.   Sx began 6 days ago with sore throat (brief, resolved), then developed rhinitis, nasal congestion, sinus pressure/tenderness, has had sinus pressure headache. Reports had dry cough on day 3 but resolved. Report had sense of chest congestion initially but resolved, denies dypsnea, wheezing. Reports had mild temp over weekend that has resolved.   Rash, fever/chills, muscle aches, GI sx.   Sinus pressure/headache has been very bothersome. She has tried saline nasal rinse, benadryl in the evenings and sudafed during the day. Have also been taking daily zyrtec and ibuprofen with minimal improvement.   Exposures: son had confirmed covid 19 case 2 weeks ago   Medications    Current Outpatient Medications (Respiratory):  .  cetirizine (ZYRTEC) 10 MG tablet, Take 10 mg by mouth as needed for allergies.   Current Outpatient Medications (Hematological):  Marland Kitchen  Cyanocobalamin (VITAMIN B12 PO), Take by mouth.  Current Outpatient Medications (Other):  Marland Kitchen  Cholecalciferol (VITAMIN D3 PO), Take by mouth. .  fluconazole (DIFLUCAN) 150 MG tablet, Take one tablet  by mouth at onset of yeast symptoms.  Take second tablet on day three. .  Omega-3 Fatty Acids (FISH OIL PO), Take by mouth. .  Zinc 50 MG TABS, Take by mouth.  Allergies: No Known Allergies  Problem list She has Environmental allergies; DDD (degenerative disc disease), lumbar; Obesity (BMI 30.0-34.9); History of dysplastic nevus; Vitamin D deficiency; Melasma; and B12 deficiency on their problem list.   Social History:   reports that she has never smoked. She has never used smokeless tobacco. She reports current alcohol use of about 3.0 standard drinks of alcohol per week. She reports that she does not use drugs.   Observations/Objective:  General : Well sounding patient in no apparent distress HEENT: Mildly hoarse vocal quality, single dry cough during this visit Lungs: speaks in complete sentences, no audible wheezing, no apparent distress Neurological: alert, oriented x 3 Psychiatric: pleasant, judgement appropriate   Assessment and Plan:  Suspected COVID-19  URI sx in vaccinated pt, presumed covid 19 due to family member with confirmed case Currently symptoms are mild Low risk hx EC bASA daily for clot prevention unless contraindicated, regular walking/calf exercises Take tylenol PRN temp 101+ Push hydration Sx supportive therapy Steroid taper was offered  Immune support with vitamin C, zinc, vitamin D reviewed Follow up via mychart or telephone if needed Advised patient obtain O2 monitor; present to ED if persistently <88% or with severe dyspnea, any CP, fever uncontrolled by tylenol, confusion, sudden decline Should remain in isolation until at least 10 days from onset of sx, 24-48 hours fever free without  tylenol, sx such as cough are improved.    Follow Up Instructions:  I discussed the assessment and treatment plan with the patient. The patient was provided an opportunity to ask questions and all were answered. The patient agreed with the plan and demonstrated an  understanding of the instructions.   The patient was advised to call back or seek an in-person evaluation if the symptoms worsen or if the condition fails to improve as anticipated.  I provided 15 minutes of non-face-to-face time during this encounter.   Izora Ribas, NP

## 2021-06-01 ENCOUNTER — Encounter: Payer: 59 | Admitting: Adult Health

## 2021-06-04 ENCOUNTER — Encounter: Payer: 59 | Admitting: Nurse Practitioner

## 2021-06-17 NOTE — Progress Notes (Signed)
Complete Physical  Assessment and Plan:  Paula Garcia was seen today for annual exam.  Diagnoses and all orders for this visit:  Encounter for routine adult health examination with abnormal findings       - Yearly  DDD (degenerative disc disease), lumbar       - Symptoms well controlled, Uses NSAIDS PRN       - Nerve conduction study which was normal, persistent left leg numbness.  Monitor, if symptoms worsen notify office and will refer to PT  Obesity (BMI 30.0-34.9)       - Long discussion about weight loss, diet, and exercise       -  Pt has stopped running due to plantar fasciitis, plans to start going to the gym with her family  Hypertriglyceridemia -     COMPLETE METABOLIC PANEL WITH GFR -     Lipid panel  Screening for diabetes mellitus -     Hemoglobin A1c  Screening for thyroid disorder -     TSH  Vitamin D deficiency -     VITAMIN D 25 Hydroxy (Vit-D Deficiency, Fractures)  Medication management -     CBC with Differential/Platelet -     COMPLETE METABOLIC PANEL WITH GFR -     Lipid panel -     TSH -     Hemoglobin A1c -     VITAMIN D 25 Hydroxy (Vit-D Deficiency, Fractures) -     Magnesium -     Vitamin B12 -     Microalbumin / creatinine urine ratio -     Urinalysis, Routine w reflex microscopic  B12 deficiency -     Vitamin B12  Screening, anemia, deficiency, iron -     CBC with Differential/Platelet  Screening for blood or protein in urine -     Microalbumin / creatinine urine ratio -     Urinalysis, Routine w reflex microscopic   Environmental aLLERGIES       - Zyrtec PRN  HX of Dysplastic Nevi       - Referral to dermatology for skin check  Discussed med's effects and SE's. Screening labs and tests as requested with regular follow-up as recommended. Over 40 minutes of exam, counseling, chart review, and complex, high level critical decision making was performed this visit.   Future Appointments  Date Time Provider [place]   06/21/2022  3:00 PM Paula Bernheim, NP [facility] None    HPI  45 y.o. Caucasian female  presents for a complete physical and follow up. She has Environmental allergies; DDD (degenerative disc disease), lumbar; Obesity (BMI 30.0-34.9); History of dysplastic nevus; Vitamin D deficiency; Melasma; and B12 deficiency on their problem list.   She is married, works in [employer] (back in office 1 day a week), 11 year son She has no complaints at this time.  Sees OBGYN Dr. Willis Garcia annually.   This past year reported bil leg numbness, hx of DDD, saw neuro Dr. Posey Garcia who found S1 radiculopathy by nerve conduction study, did PT and now manages well with stretches at home PRN.    BMI is Body mass index is 33.04 kg/m., she has been working on diet and exercise.  She religiously exercises 4-5 days a week - has not been doing much exercises  Wt Readings from Last 3 Encounters:  06/18/21 201 lb 9.6 oz (91.4 kg)  05/29/20 195 lb (88.5 kg)  03/25/20 193 lb (87.5 kg)   Today their BP is BP: 130/72 She does workout.  She denies chest pain, shortness of breath, dizziness.    The cholesterol last visit was:   Lab Results  Component Value Date   CHOL 165 05/29/2020   HDL 51 05/29/2020   LDLCALC 93 05/29/2020   TRIG 115 05/29/2020   CHOLHDL 3.2 05/29/2020    Last A1C in the office was:  Lab Results  Component Value Date   HGBA1C 5.0 05/23/2019   Last GFR: Lab Results  Component Value Date   GFRNONAA 108 05/29/2020   Patient is currently on Vitamin D supplement, unsure of dose   Lab Results  Component Value Date   VD25OH 63 05/29/2020     She is on b12 supplement Lab Results  Component Value Date   [id number] 05/29/2020     Current Medications:  Current Outpatient Medications on File Prior to Visit  Medication Sig Dispense Refill   cetirizine (ZYRTEC) 10 MG tablet Take 10 mg by mouth as needed for allergies.     Cyanocobalamin (VITAMIN B12 PO) Take by mouth.     Omega-3 Fatty Acids  (FISH OIL PO) Take by mouth.     valACYclovir (VALTREX) 500 MG tablet Take 500 mg by mouth as needed.     Zinc 50 MG TABS Take by mouth.     Cholecalciferol (VITAMIN D3 PO) Take by mouth.     fluconazole (DIFLUCAN) 150 MG tablet Take one tablet by mouth at onset of yeast symptoms.  Take second tablet on day three. 2 tablet 0   predniSONE (DELTASONE) 20 MG tablet 2 tablets daily for 3 days, 1 tablet daily for 4 days. 10 tablet 0   No current facility-administered medications on file prior to visit.   Allergies:  No Known Allergies Medical History:  She has Environmental allergies; DDD (degenerative disc disease), lumbar; Obesity (BMI 30.0-34.9); History of dysplastic nevus; Vitamin D deficiency; Melasma; and B12 deficiency on their problem list. Health Maintenance:   Immunization History  Administered Date(s) Administered   Influenza Inj Mdck Quad With Preservative 09/06/2018, 08/21/2019   PFIZER(Purple Top)SARS-COV-2 Vaccination 02/15/2020, 03/11/2020   Tdap 10/07/2011   Tetanus: 2012, due 09/2021 Pneumovax: N/A Prevnar 13: N/A Flu vaccine: 07/2019 Zostavax: N/A  Covid 19: [employer] 02/15/20, 03/11/20 , Booster 01/05/21  LMP: Patient's last menstrual period was 06/08/2021. Pap: 2020, has q3y by Dr. Willis Garcia, due mirena switch 06/2021 3DMGM: 07/18/20 negative due 06/2021  DEXA: N/A Colonoscopy: No strong family history   Last Dental Exam: 12/2020, q6 months, has scheduled Last Eye Exam: 2021, wears readers, no concerns Last Derm Exam: history of dysplastic nevus of back , refer to dermatologist  Patient Care Team: Paula Pinto, MD as PCP - General (Internal Medicine)  Surgical History:  She has a past surgical history that includes Cervical fusion (2012); Colposcopy; Cesarean section (2010); Wisdom tooth extraction; and Laparoscopic abdominal exploration [id number]). Family History:  Herfamily history includes Brain cancer in her paternal grandmother; COPD in her father; Cancer  in her mother; Congenital Murmur in her son; Heart disease in her father; Stroke in her maternal grandfather; Throat cancer in her maternal aunt. Social History:  She reports that she has never smoked. She has never used smokeless tobacco. She reports current alcohol use of about 3.0 standard drinks of alcohol per week. She reports that she does not use drugs.  Review of Systems: Review of Systems  Constitutional:  Negative for chills, fever, malaise/fatigue and weight loss.  HENT:  Negative for congestion, hearing loss, sinus pain, sore throat and tinnitus.  Eyes:  Negative for blurred vision and double vision.  Respiratory:  Negative for cough, hemoptysis, sputum production, shortness of breath and wheezing.   Cardiovascular:  Negative for chest pain, palpitations, orthopnea, claudication and leg swelling.  Gastrointestinal:  Negative for abdominal pain, blood in stool, constipation, diarrhea, heartburn, melena, nausea and vomiting.  Genitourinary: Negative.  Negative for dysuria and urgency.  Musculoskeletal:  Negative for back pain, falls, joint pain, myalgias and neck pain.  Skin:  Negative for rash.  Neurological:  Negative for dizziness, tingling, tremors, sensory change, weakness and headaches.  Endo/Heme/Allergies:  Negative for polydipsia. Does not bruise/bleed easily.  Psychiatric/Behavioral: Negative.  Negative for depression and suicidal ideas. The patient is not nervous/anxious and does not have insomnia.   All other systems reviewed and are negative.  Physical Exam: Estimated body mass index is 33.04 kg/m as calculated from the following:   Height as of this encounter: 5' 5.5" (1.664 m).   Weight as of this encounter: 201 lb 9.6 oz (91.4 kg). BP 130/72   Pulse 81   Temp 97.7 F (36.5 C)   Ht 5' 5.5" (1.664 m)   Wt 201 lb 9.6 oz (91.4 kg)   LMP 06/08/2021   SpO2 98%   BMI 33.04 kg/m  General Appearance: Well nourished, in no apparent distress.  Eyes: PERRLA, EOMs,  conjunctiva no swelling or erythema Sinuses: No Frontal/maxillary tenderness  ENT/Mouth: Ext aud canals clear, normal light reflex with TMs without erythema, bulging. Good dentition. No erythema, swelling, or exudate on post pharynx. Tonsils not swollen or erythematous. Hearing normal.  Neck: Supple, thyroid normal. No bruits  Respiratory: Respiratory effort normal, BS equal bilaterally without rales, rhonchi, wheezing or stridor.  Cardio: RRR without murmurs, rubs or gallops. Brisk peripheral pulses without edema.  Chest: symmetric, with normal excursions and percussion.  Breasts: Defer to GYN  Abdomen: Soft, nontender, no guarding, rebound, hernias, masses, or organomegaly.  Lymphatics: Non tender without lymphadenopathy.  Genitourinary: Defer to GYN Musculoskeletal: Full ROM all peripheral extremities,5/5 strength, and normal gait.  Skin: Warm, dry without rashes, ecchymosis. She has numerous small nevi, flat, <6 mm; two to back with irregular borders, unchanged from last year. Flat brown discoloration approx 2 cm area to left cheek.  Neuro: Cranial nerves intact, reflexes equal bilaterally. Normal muscle tone, no cerebellar symptoms. Sensation intact.  Psych: Awake and oriented X 3, normal affect, Insight and Judgment appropriate.     Paula Garcia W Paula Garcia 3:14 PM [facility]

## 2021-06-18 ENCOUNTER — Ambulatory Visit (INDEPENDENT_AMBULATORY_CARE_PROVIDER_SITE_OTHER): Payer: 59 | Admitting: Nurse Practitioner

## 2021-06-18 ENCOUNTER — Encounter: Payer: Self-pay | Admitting: Nurse Practitioner

## 2021-06-18 ENCOUNTER — Other Ambulatory Visit: Payer: Self-pay

## 2021-06-18 VITALS — BP 130/72 | HR 81 | Temp 97.7°F | Ht 65.5 in | Wt 201.6 lb

## 2021-06-18 DIAGNOSIS — Z1389 Encounter for screening for other disorder: Secondary | ICD-10-CM

## 2021-06-18 DIAGNOSIS — E538 Deficiency of other specified B group vitamins: Secondary | ICD-10-CM

## 2021-06-18 DIAGNOSIS — M5136 Other intervertebral disc degeneration, lumbar region: Secondary | ICD-10-CM

## 2021-06-18 DIAGNOSIS — Z131 Encounter for screening for diabetes mellitus: Secondary | ICD-10-CM

## 2021-06-18 DIAGNOSIS — E559 Vitamin D deficiency, unspecified: Secondary | ICD-10-CM

## 2021-06-18 DIAGNOSIS — Z1329 Encounter for screening for other suspected endocrine disorder: Secondary | ICD-10-CM

## 2021-06-18 DIAGNOSIS — Z0001 Encounter for general adult medical examination with abnormal findings: Secondary | ICD-10-CM

## 2021-06-18 DIAGNOSIS — Z86018 Personal history of other benign neoplasm: Secondary | ICD-10-CM

## 2021-06-18 DIAGNOSIS — Z0000 Encounter for general adult medical examination without abnormal findings: Secondary | ICD-10-CM

## 2021-06-18 DIAGNOSIS — E781 Pure hyperglyceridemia: Secondary | ICD-10-CM

## 2021-06-18 DIAGNOSIS — E669 Obesity, unspecified: Secondary | ICD-10-CM

## 2021-06-18 DIAGNOSIS — Z79899 Other long term (current) drug therapy: Secondary | ICD-10-CM

## 2021-06-18 DIAGNOSIS — Z130 Encounter for screening for diseases of the blood and blood-forming organs and certain disorders involving the immune mechanism: Secondary | ICD-10-CM

## 2021-06-18 DIAGNOSIS — Z9109 Other allergy status, other than to drugs and biological substances: Secondary | ICD-10-CM

## 2021-06-18 NOTE — Patient Instructions (Signed)

## 2021-06-19 LAB — CBC WITH DIFFERENTIAL/PLATELET
Absolute Monocytes: 654 cells/uL (ref 200–950)
Basophils Absolute: 60 cells/uL (ref 0–200)
Basophils Relative: 0.7 %
Eosinophils Absolute: 181 cells/uL (ref 15–500)
Eosinophils Relative: 2.1 %
HCT: 41.5 % (ref 35.0–45.0)
Hemoglobin: 13.8 g/dL (ref 11.7–15.5)
Lymphs Abs: 2382 cells/uL (ref 850–3900)
MCH: 28.7 pg (ref 27.0–33.0)
MCHC: 33.3 g/dL (ref 32.0–36.0)
MCV: 86.3 fL (ref 80.0–100.0)
MPV: 9.9 fL (ref 7.5–12.5)
Monocytes Relative: 7.6 %
Neutro Abs: 5323 cells/uL (ref 1500–7800)
Neutrophils Relative %: 61.9 %
Platelets: 287 10*3/uL (ref 140–400)
RBC: 4.81 10*6/uL (ref 3.80–5.10)
RDW: 12.4 % (ref 11.0–15.0)
Total Lymphocyte: 27.7 %
WBC: 8.6 10*3/uL (ref 3.8–10.8)

## 2021-06-19 LAB — COMPLETE METABOLIC PANEL WITH GFR
AG Ratio: 1.7 (calc) (ref 1.0–2.5)
ALT: 18 U/L (ref 6–29)
AST: 21 U/L (ref 10–35)
Albumin: 4.5 g/dL (ref 3.6–5.1)
Alkaline phosphatase (APISO): 69 U/L (ref 31–125)
BUN: 15 mg/dL (ref 7–25)
CO2: 28 mmol/L (ref 20–32)
Calcium: 9.7 mg/dL (ref 8.6–10.2)
Chloride: 103 mmol/L (ref 98–110)
Creat: 0.7 mg/dL (ref 0.50–0.99)
Globulin: 2.6 g/dL (calc) (ref 1.9–3.7)
Glucose, Bld: 92 mg/dL (ref 65–99)
Potassium: 4.9 mmol/L (ref 3.5–5.3)
Sodium: 139 mmol/L (ref 135–146)
Total Bilirubin: 1.3 mg/dL — ABNORMAL HIGH (ref 0.2–1.2)
Total Protein: 7.1 g/dL (ref 6.1–8.1)
eGFR: 108 mL/min/{1.73_m2} (ref >=60)

## 2021-06-19 LAB — LIPID PANEL
Cholesterol: 191 mg/dL (ref <200)
HDL: 56 mg/dL (ref >=50)
LDL Cholesterol (Calc): 98 mg/dL (calc)
Non-HDL Cholesterol (Calc): 135 mg/dL (calc) — ABNORMAL HIGH (ref <130)
Total CHOL/HDL Ratio: 3.4 (calc) (ref <5.0)
Triglycerides: 241 mg/dL — ABNORMAL HIGH (ref <150)

## 2021-06-19 LAB — HEMOGLOBIN A1C
Hgb A1c MFr Bld: 4.9 % of total Hgb (ref <5.7)
Mean Plasma Glucose: 94 mg/dL
eAG (mmol/L): 5.2 mmol/L

## 2021-06-19 LAB — URINALYSIS, ROUTINE W REFLEX MICROSCOPIC
Bilirubin Urine: NEGATIVE
Glucose, UA: NEGATIVE
Hgb urine dipstick: NEGATIVE
Ketones, ur: NEGATIVE
Leukocytes,Ua: NEGATIVE
Nitrite: NEGATIVE
Protein, ur: NEGATIVE
Specific Gravity, Urine: 1.006 (ref 1.001–1.035)
pH: 6.5 (ref 5.0–8.0)

## 2021-06-19 LAB — MAGNESIUM: Magnesium: 2 mg/dL (ref 1.5–2.5)

## 2021-06-19 LAB — MICROALBUMIN / CREATININE URINE RATIO
Creatinine, Urine: 20 mg/dL (ref 20–275)
Microalb Creat Ratio: 10 mcg/mg creat (ref <30)
Microalb, Ur: 0.2 mg/dL

## 2021-06-19 LAB — VITAMIN B12: Vitamin B-12: 456 pg/mL (ref 200–1100)

## 2021-06-19 LAB — TSH: TSH: 2.42 mIU/L

## 2021-06-19 LAB — VITAMIN D 25 HYDROXY (VIT D DEFICIENCY, FRACTURES): Vit D, 25-Hydroxy: 51 ng/mL (ref 30–100)

## 2021-07-24 LAB — HM MAMMOGRAPHY

## 2021-09-01 ENCOUNTER — Encounter: Payer: Self-pay | Admitting: Internal Medicine

## 2021-10-02 ENCOUNTER — Ambulatory Visit: Payer: 59 | Admitting: Nurse Practitioner

## 2021-10-05 NOTE — Progress Notes (Signed)
Assessment and Plan:  Paula Garcia was seen today for depression.  Diagnoses and all orders for this visit:  Anxiety -     busPIRone (BUSPAR) 5 MG tablet; Take 1 tablet (5 mg total) by mouth 3 (three) times daily. Pt reluctant to take daily medication will try Buspar as needed.  Had opposite reaction with diazepam in the past.  She is to call if she has increased anxiety with medication or it is not working and will discuss possible SSRI  Strongly encouraged visiting therapist.  Strongly encouraged exercise and behavior modifications   Flu vaccine need -     Flu Vaccine QUAD 6+ mos PF IM (Fluarix Quad PF)      Further disposition pending results of labs. Discussed med's effects and SE's.   Over 30 minutes of exam, counseling, chart review, and critical decision making was performed.   Future Appointments  Date Time Provider [place]  06/23/2022  3:00 PM Liane Comber, NP [facility] None    ------------------------------------------------------------------------------------------------------------------   HPI BP 116/80   Pulse 84   Temp (!) 97.3 F (36.3 C)   Wt 205 lb (93 kg)   SpO2 99%   BMI 33.59 kg/m  45 y.o.female presents for depression.  Starting last week she has noticed feeling overwhelmed frequently. New work position started in 04/2021- more stress.  Does work a hybrid position between home and work. 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 She is the sole breadwinner and a mother.  Very stressful.  Past Medical History:  Diagnosis Date   Allergy    DDD (degenerative disc disease)    Endometriosis      No Known Allergies  Current Outpatient Medications on File Prior to Visit  Medication Sig   cetirizine (ZYRTEC) 10 MG tablet Take 10 mg by mouth as needed for allergies.   Cholecalciferol (VITAMIN D3 PO) Take by mouth.   Cyanocobalamin (VITAMIN B12 PO) Take by mouth.   Omega-3 Fatty Acids (FISH OIL PO) Take by mouth.   valACYclovir  (VALTREX) 500 MG tablet Take 500 mg by mouth as needed.   Zinc 50 MG TABS Take by mouth.   fluconazole (DIFLUCAN) 150 MG tablet Take one tablet by mouth at onset of yeast symptoms.  Take second tablet on day three.   predniSONE (DELTASONE) 20 MG tablet 2 tablets daily for 3 days, 1 tablet daily for 4 days.   No current facility-administered medications on file prior to visit.    ROS: all negative except above.   Physical Exam:  BP 116/80   Pulse 84   Temp (!) 97.3 F (36.3 C)   Wt 205 lb (93 kg)   SpO2 99%   BMI 33.59 kg/m   General Appearance: Well nourished, in no apparent distress. Eyes: PERRLA, EOMs, conjunctiva no swelling or erythema Sinuses: No Frontal/maxillary tenderness ENT/Mouth: Ext aud canals clear, TMs without erythema, bulging. No erythema, swelling, or exudate on post pharynx.  Tonsils not swollen or erythematous. Hearing normal.  Neck: Supple, thyroid normal.  Respiratory: Respiratory effort normal, BS equal bilaterally without rales, rhonchi, wheezing or stridor.  Cardio: RRR with no MRGs. Brisk peripheral pulses without edema.  Abdomen: Soft, + BS.  Non tender, no guarding, rebound, hernias, masses. Lymphatics: Non tender without lymphadenopathy.  Musculoskeletal: Full ROM, 5/5 strength, normal gait.  Skin: Warm, dry without rashes, lesions, ecchymosis.  Neuro: Cranial nerves intact. Normal muscle tone, no cerebellar symptoms. Sensation intact.  Psych: Awake and oriented X 3, normal affect,  Insight and Judgment appropriate.     Magda Bernheim, NP 11:42 AM [facility]

## 2021-10-07 ENCOUNTER — Encounter: Payer: Self-pay | Admitting: Nurse Practitioner

## 2021-10-07 ENCOUNTER — Other Ambulatory Visit: Payer: Self-pay

## 2021-10-07 ENCOUNTER — Ambulatory Visit (INDEPENDENT_AMBULATORY_CARE_PROVIDER_SITE_OTHER): Payer: 59 | Admitting: Nurse Practitioner

## 2021-10-07 VITALS — BP 116/80 | HR 84 | Temp 97.3°F | Wt 205.0 lb

## 2021-10-07 DIAGNOSIS — Z23 Encounter for immunization: Secondary | ICD-10-CM | POA: Diagnosis not present

## 2021-10-07 DIAGNOSIS — F419 Anxiety disorder, unspecified: Secondary | ICD-10-CM | POA: Diagnosis not present

## 2021-10-07 MED ORDER — BUSPIRONE HCL 5 MG PO TABS: 5.0000 mg | ORAL_TABLET | Freq: Three times a day (TID) | ORAL | 0 refills | Status: DC

## 2021-10-07 NOTE — Patient Instructions (Signed)
Buspirone Tablets What is this medication? BUSPIRONE (byoo SPYE rone) treats anxiety. It works by balancing the levels of dopamine and serotonin in your brain, hormones that help regulate mood. This medicine may be used for other purposes; ask your health care provider or pharmacist if you have questions. COMMON BRAND NAME(S): BuSpar, Buspar Dividose What should I tell my care team before I take this medication? They need to know if you have any of these conditions: Kidney or liver disease An unusual or allergic reaction to buspirone, other medications, foods, dyes, or preservatives Pregnant or trying to get pregnant Breast-feeding How should I use this medication? Take this medication by mouth with a glass of water. Follow the directions on the prescription label. You may take this medication with or without food. To ensure that this medication always works the same way for you, you should take it either always with or always without food. Take your doses at regular intervals. Do not take your medication more often than directed. Do not stop taking except on the advice of your care team. Talk to your care team about the use of this medication in children. Special care may be needed. Overdosage: If you think you have taken too much of this medicine contact a poison control center or emergency room at once. NOTE: This medicine is only for you. Do not share this medicine with others. What if I miss a dose? If you miss a dose, take it as soon as you can. If it is almost time for your next dose, take only that dose. Do not take double or extra doses. What may interact with this medication? Do not take this medication with any of the following: Linezolid MAOIs like Carbex, Eldepryl, Marplan, Nardil, and Parnate Methylene blue Procarbazine This medication may also interact with the following: Diazepam Digoxin Diltiazem Erythromycin Grapefruit juice Haloperidol Medications for mental  depression or mood problems Medications for seizures like carbamazepine, phenobarbital and phenytoin Nefazodone Other medications for anxiety Rifampin Ritonavir Some antifungal medications like itraconazole, ketoconazole, and voriconazole Verapamil Warfarin This list may not describe all possible interactions. Give your health care provider a list of all the medicines, herbs, non-prescription drugs, or dietary supplements you use. Also tell them if you smoke, drink alcohol, or use illegal drugs. Some items may interact with your medicine. What should I watch for while using this medication? Visit your care team for regular checks on your progress. It may take 1 to 2 weeks before your anxiety gets better. You may get drowsy or dizzy. Do not drive, use machinery, or do anything that needs mental alertness until you know how this medication affects you. Do not stand or sit up quickly, especially if you are an older patient. This reduces the risk of dizzy or fainting spells. Alcohol can make you more drowsy and dizzy. Avoid alcoholic drinks. What side effects may I notice from receiving this medication? Side effects that you should report to your care team as soon as possible: Allergic reactions--skin rash, itching, hives, swelling of the face, lips, tongue, or throat Irritability, confusion, fast or irregular heartbeat, muscle stiffness, twitching muscles, sweating, high fever, seizure, chills, vomiting, diarrhea, which may be signs of serotonin syndrome Side effects that usually do not require medical attention (report to your care team if they continue or are bothersome): Anxiety or nervousness Dizziness Drowsiness Headache Nausea Trouble sleeping This list may not describe all possible side effects. Call your doctor for medical advice about side effects. You may report   side effects to FDA at 1-800-FDA-1088. Where should I keep my medication? Keep out of the reach of children. Store at room  temperature below 30 degrees C (86 degrees F). Protect from light. Keep container tightly closed. Throw away any unused medication after the expiration date. NOTE: This sheet is a summary. It may not cover all possible information. If you have questions about this medicine, talk to your doctor, pharmacist, or health care provider.  2022 Elsevier/Gold Standard (2021-02-05 00:00:00)

## 2022-01-12 NOTE — Progress Notes (Unsigned)
FOLLOW UP  Assessment and Plan:   Hypertension Well controlled with current medications  Monitor blood pressure at home; patient to call if consistently greater than 130/80 Continue DASH diet.   Reminder to go to the ER if any CP, SOB, nausea, dizziness, severe HA, changes vision/speech, left arm numbness and tingling and jaw pain.  Cholesterol Currently at goal;  Continue low cholesterol diet and exercise.  Check lipid panel.    Obesity with co morbidities Long discussion about weight loss, diet, and exercise Recommended diet heavy in fruits and veggies and low in animal meats, cheeses, and dairy products, appropriate calorie intake Discussed ideal weight for height (below ***) and initial weight goal (***) Patient will work on *** Will follow up in 3 months  Vitamin D Def At goal at last visit; continue supplementation to maintain goal of 60-100 Defer Vit D level  Continue diet and meds as discussed. Further disposition pending results of labs. Discussed med's effects and SE's.   Over 30 minutes of exam, counseling, chart review, and critical decision making was performed.   Future Appointments  Date Time Provider Kenneth  01/13/2022 10:30 AM Magda Bernheim, NP [facility] None  06/23/2022  3:00 PM Liane Comber, NP [facility] None    ----------------------------------------------------------------------------------------------------------------------  HPI 46 y.o. female  presents for 3 month follow up on hypertension, cholesterol, diabetes, weight and vitamin D deficiency.   BMI is There is no height or weight on file to calculate BMI., she {HAS HAS IHK:74259} been working on diet and exercise. Wt Readings from Last 3 Encounters:  10/07/21 205 lb (93 kg)  06/18/21 201 lb 9.6 oz (91.4 kg)  05/29/20 195 lb (88.5 kg)    Her blood pressure has been controlled at home, today their BP is   BP Readings from Last 3 Encounters:  10/07/21 116/80  06/18/21 130/72   05/29/20 106/70     She {DOES_DOES DGL:87564} workout. She denies chest pain, shortness of breath, dizziness.   She {ACTION; IS/IS PPI:95188416} on cholesterol medication {Cholesterol meds:21887} and denies myalgias. Her cholesterol {ACTION; IS/IS NOT:21021397} at goal. The cholesterol last visit was:   Lab Results  Component Value Date   CHOL 191 06/18/2021   HDL 56 06/18/2021   LDLCALC 98 06/18/2021   TRIG 241 (H) 06/18/2021   CHOLHDL 3.4 06/18/2021    She {Has/has not:18111} been working on diet and exercise for prediabetes, and denies {Symptoms; diabetes w/o none:19199}. Last A1C in the office was:  Lab Results  Component Value Date   HGBA1C 4.9 06/18/2021   Patient is on Vitamin D supplement.   Lab Results  Component Value Date   VD25OH 51 06/18/2021        Current Medications:  Current Outpatient Medications on File Prior to Visit  Medication Sig   busPIRone (BUSPAR) 5 MG tablet Take 1 tablet (5 mg total) by mouth 3 (three) times daily.   cetirizine (ZYRTEC) 10 MG tablet Take 10 mg by mouth as needed for allergies.   Cholecalciferol (VITAMIN D3 PO) Take by mouth.   Cyanocobalamin (VITAMIN B12 PO) Take by mouth.   Omega-3 Fatty Acids (FISH OIL PO) Take by mouth.   valACYclovir (VALTREX) 500 MG tablet Take 500 mg by mouth as needed.   Zinc 50 MG TABS Take by mouth.   No current facility-administered medications on file prior to visit.     Allergies: No Known Allergies   Medical History:  Past Medical History:  Diagnosis Date   Allergy  DDD (degenerative disc disease)    Endometriosis    Family history- Reviewed and unchanged Social history- Reviewed and unchanged   Review of Systems:  ROS    Physical Exam: There were no vitals taken for this visit. Wt Readings from Last 3 Encounters:  10/07/21 205 lb (93 kg)  06/18/21 201 lb 9.6 oz (91.4 kg)  05/29/20 195 lb (88.5 kg)   General Appearance: Well nourished, in no apparent distress. Eyes:  PERRLA, EOMs, conjunctiva no swelling or erythema Sinuses: No Frontal/maxillary tenderness ENT/Mouth: Ext aud canals clear, TMs without erythema, bulging. No erythema, swelling, or exudate on post pharynx.  Tonsils not swollen or erythematous. Hearing normal.  Neck: Supple, thyroid normal.  Respiratory: Respiratory effort normal, BS equal bilaterally without rales, rhonchi, wheezing or stridor.  Cardio: RRR with no MRGs. Brisk peripheral pulses without edema.  Abdomen: Soft, + BS.  Non tender, no guarding, rebound, hernias, masses. Lymphatics: Non tender without lymphadenopathy.  Musculoskeletal: Full ROM, 5/5 strength, Normal gait Skin: Warm, dry without rashes, lesions, ecchymosis.  Neuro: Cranial nerves intact. No cerebellar symptoms.  Psych: Awake and oriented X 3, normal affect, Insight and Judgment appropriate.    Magda Bernheim, NP 9:24 AM [facility]

## 2022-01-13 ENCOUNTER — Ambulatory Visit: Payer: 59 | Admitting: Nurse Practitioner

## 2022-06-21 ENCOUNTER — Encounter: Payer: 59 | Admitting: Nurse Practitioner

## 2022-06-23 ENCOUNTER — Encounter: Payer: Self-pay | Admitting: Nurse Practitioner

## 2022-06-23 ENCOUNTER — Ambulatory Visit (INDEPENDENT_AMBULATORY_CARE_PROVIDER_SITE_OTHER): Payer: 59 | Admitting: Nurse Practitioner

## 2022-06-23 VITALS — BP 128/78 | HR 78 | Temp 97.5°F | Ht 65.5 in | Wt 200.2 lb

## 2022-06-23 DIAGNOSIS — I10 Essential (primary) hypertension: Secondary | ICD-10-CM | POA: Diagnosis not present

## 2022-06-23 DIAGNOSIS — Z23 Encounter for immunization: Secondary | ICD-10-CM | POA: Diagnosis not present

## 2022-06-23 DIAGNOSIS — M5136 Other intervertebral disc degeneration, lumbar region: Secondary | ICD-10-CM

## 2022-06-23 DIAGNOSIS — Z131 Encounter for screening for diabetes mellitus: Secondary | ICD-10-CM

## 2022-06-23 DIAGNOSIS — E538 Deficiency of other specified B group vitamins: Secondary | ICD-10-CM

## 2022-06-23 DIAGNOSIS — Z0000 Encounter for general adult medical examination without abnormal findings: Secondary | ICD-10-CM

## 2022-06-23 DIAGNOSIS — Z136 Encounter for screening for cardiovascular disorders: Secondary | ICD-10-CM | POA: Diagnosis not present

## 2022-06-23 DIAGNOSIS — Z1329 Encounter for screening for other suspected endocrine disorder: Secondary | ICD-10-CM

## 2022-06-23 DIAGNOSIS — Z9109 Other allergy status, other than to drugs and biological substances: Secondary | ICD-10-CM

## 2022-06-23 DIAGNOSIS — E559 Vitamin D deficiency, unspecified: Secondary | ICD-10-CM

## 2022-06-23 DIAGNOSIS — Z1389 Encounter for screening for other disorder: Secondary | ICD-10-CM

## 2022-06-23 DIAGNOSIS — E669 Obesity, unspecified: Secondary | ICD-10-CM

## 2022-06-23 DIAGNOSIS — Z79899 Other long term (current) drug therapy: Secondary | ICD-10-CM

## 2022-06-23 DIAGNOSIS — E781 Pure hyperglyceridemia: Secondary | ICD-10-CM

## 2022-06-23 DIAGNOSIS — Z86018 Personal history of other benign neoplasm: Secondary | ICD-10-CM

## 2022-06-23 DIAGNOSIS — Z1211 Encounter for screening for malignant neoplasm of colon: Secondary | ICD-10-CM

## 2022-06-23 DIAGNOSIS — Z0001 Encounter for general adult medical examination with abnormal findings: Secondary | ICD-10-CM

## 2022-06-23 NOTE — Progress Notes (Unsigned)
Complete Physical  Assessment and Plan:  Paula Garcia was seen today for annual exam.  Diagnoses and all orders for this visit:  Encounter for routine adult health examination with abnormal findings Due Annually  DDD (degenerative disc disease), lumbar No recent flares Symptoms well controlled, Uses NSAIDS PRN Nerve conduction study which was normal, persistent left leg numbness.  Monitor, if symptoms worsen notify office and will refer to PT  Obesity (BMI 30.0-34.9) Discussed appropriate BMI Goal of losing 1 lb per month. Diet modification. Physical activity. Encouraged/praised to build confidence.   Hypertriglyceridemia Discussed lifestyle modifications. Recommended diet heavy in fruits and veggies, omega 3's. Decrease consumption of animal meats, cheeses, and dairy products. Remain active and exercise as tolerated. Continue to monitor. Check CMP/GFR Check Lipid panel  Screening for diabetes mellitus Education: Reviewed 'ABCs' of diabetes management  A1C (<7) Blood pressure (<130/80) Cholesterol (LDL <70) Continue Eye Exam yearly  Continue Dental Exam Q6 mo Discussed dietary recommendations Discussed Physical Activity recommendations Foot exam UTD  Screening for thyroid disorder Check TSH  Vitamin D deficiency Continue supplement Check Vit D  Medication management All medications discussed and reviewed in full. All questions and concerns regarding medications addressed.    B12 deficiency Continue supplement Check B12  Screening for blood or protein in urine Urinalysis, Routine w reflex microscopic   Environmental allergies Avoid triggers. OTC antihistamine  HX of Dysplastic Nevi Continue yearly derm checks  Screening for colon cancer  Cologuard  Tetanus Immunization Due Vaccine Administered VIS provided.  Orders Placed This Encounter  Procedures   MICROSCOPIC MESSAGE   Tdap vaccine greater than or equal to 7yo IM   CBC with  Differential/Platelet   COMPLETE METABOLIC PANEL WITH GFR   Magnesium   Lipid panel   TSH   Hemoglobin A1c   VITAMIN D 25 Hydroxy (Vit-D Deficiency, Fractures)   Cologuard   Urinalysis, Routine w reflex microscopic   EKG 12-Lead    Discussed med's effects and SE's. Screening labs and tests as requested with regular follow-up as recommended.  Over 30 minutes of exam, counseling, chart review, and complex, high level critical decision making was performed this visit.   Future Appointments  Date Time Provider [place]  06/27/2023  3:00 PM Darrol Jump, NP [facility] None    HPI  46 y.o. Caucasian female  presents for a complete physical and follow up. She has Environmental allergies; DDD (degenerative disc disease), lumbar; Obesity (BMI 30.0-34.9); History of dysplastic nevus; Vitamin D deficiency; Melasma; and B12 deficiency on their problem list.   She is married, works in [place] (back in office 1 day a week), 11 year son She has no complaints at this time. Sees OBGYN Dr. Willis Modena annually.  Had mammogram 07/2021 Negative  This past year reported bil leg numbness, hx of DDD, saw neuro Dr. Posey Pronto who found S1 radiculopathy by nerve conduction study, did PT and now manages well with stretches at home PRN.    BMI is Body mass index is 32.81 kg/m., she has been working on diet and exercise.  She exercises 4-5 days a week  Wt Readings from Last 3 Encounters:  06/23/22 200 lb 3.2 oz (90.8 kg)  10/07/21 205 lb (93 kg)  06/18/21 201 lb 9.6 oz (91.4 kg)   Today their BP is BP: 128/78  She does workout. She denies chest pain, shortness of breath, dizziness.    The cholesterol last visit was:   Lab Results  Component Value Date   CHOL 191 06/18/2021  HDL 56 06/18/2021   LDLCALC 98 06/18/2021   TRIG 241 (H) 06/18/2021   CHOLHDL 3.4 06/18/2021    Last A1C in the office was:  Lab Results  Component Value Date   HGBA1C 4.9 06/18/2021   Last GFR: Lab Results  Component  Value Date   GFRNONAA 108 05/29/2020   Patient is currently on Vitamin D supplement, unsure of dose   Lab Results  Component Value Date   VD25OH 33 06/18/2021     She is on b12 supplement Lab Results  Component Value Date   VZDGLOVF64 332 06/18/2021     Current Medications:  Current Outpatient Medications on File Prior to Visit  Medication Sig Dispense Refill   busPIRone (BUSPAR) 5 MG tablet Take 1 tablet (5 mg total) by mouth 3 (three) times daily. 90 tablet 0   cetirizine (ZYRTEC) 10 MG tablet Take 10 mg by mouth as needed for allergies.     Cholecalciferol (VITAMIN D3 PO) Take by mouth.     Cyanocobalamin (VITAMIN B12 PO) Take by mouth.     Multiple Vitamin (MULTIVITAMIN) tablet Take 1 tablet by mouth daily.     Omega-3 Fatty Acids (FISH OIL PO) Take by mouth.     valACYclovir (VALTREX) 500 MG tablet Take 500 mg by mouth as needed.     Zinc 50 MG TABS Take by mouth.     No current facility-administered medications on file prior to visit.   Allergies:  No Known Allergies Medical History:  She has Environmental allergies; DDD (degenerative disc disease), lumbar; Obesity (BMI 30.0-34.9); History of dysplastic nevus; Vitamin D deficiency; Melasma; and B12 deficiency on their problem list. Health Maintenance:   Immunization History  Administered Date(s) Administered   Influenza Inj Mdck Quad With Preservative 09/06/2018, 08/21/2019   Influenza,inj,Quad PF,6+ Mos 10/07/2021   PFIZER(Purple Top)SARS-COV-2 Vaccination 02/15/2020, 03/11/2020   Tdap 10/07/2011   Tetanus: 06/2022 Pneumovax: N/A Prevnar 13: N/A Flu vaccine: 07/2021 Zostavax: N/A  Covid 19: [place] 02/15/20, 03/11/20 , Booster 01/05/21  LMP: 06/02/22 Pap: 2020, has q3y by Dr. Willis Modena,  mirena  to be removed spring:  2024 3DMGM: 07/2021 negative due 06/2022.    DEXA: N/A Colonoscopy: No strong family history She will complete cologuard 06/2022  Last Dental Exam: 12/2021, q6 months, has scheduled Last Eye Exam:  2023, wears readers, no concerns  Last Derm Exam: history of dysplastic nevus of back , refer to dermatologist  Patient Care Team: Unk Pinto, MD as PCP - General (Internal Medicine)  Surgical History:  She has a past surgical history that includes Cervical fusion (2012); Colposcopy; Cesarean section (2010); Wisdom tooth extraction; and Laparoscopic abdominal exploration (9518,8416). Family History:  Herfamily history includes Brain cancer in her paternal grandmother; COPD in her father; Cancer in her mother; Congenital Murmur in her son; Heart disease in her father; Stroke in her maternal grandfather; Throat cancer in her maternal aunt. Social History:  She reports that she has never smoked. She has never used smokeless tobacco. She reports current alcohol use of about 3.0 standard drinks of alcohol per week. She reports that she does not use drugs.  Review of Systems: Review of Systems  Constitutional:  Negative for chills, fever, malaise/fatigue and weight loss.  HENT:  Negative for congestion, hearing loss, sinus pain, sore throat and tinnitus.   Eyes:  Negative for blurred vision and double vision.  Respiratory:  Negative for cough, hemoptysis, sputum production, shortness of breath and wheezing.   Cardiovascular:  Negative for chest pain,  palpitations, orthopnea, claudication and leg swelling.  Gastrointestinal:  Negative for abdominal pain, blood in stool, constipation, diarrhea, heartburn, melena, nausea and vomiting.  Genitourinary: Negative.  Negative for dysuria and urgency.  Musculoskeletal:  Negative for back pain, falls, joint pain, myalgias and neck pain.  Skin:  Negative for rash.  Neurological:  Negative for dizziness, tingling, tremors, sensory change, weakness and headaches.  Endo/Heme/Allergies:  Negative for polydipsia. Does not bruise/bleed easily.  Psychiatric/Behavioral: Negative.  Negative for depression and suicidal ideas. The patient is not nervous/anxious  and does not have insomnia.   All other systems reviewed and are negative.   Physical Exam: Estimated body mass index is 32.81 kg/m as calculated from the following:   Height as of this encounter: 5' 5.5" (1.664 m).   Weight as of this encounter: 200 lb 3.2 oz (90.8 kg). BP 128/78   Pulse 78   Temp (!) 97.5 F (36.4 C)   Ht 5' 5.5" (1.664 m)   Wt 200 lb 3.2 oz (90.8 kg)   SpO2 98%   BMI 32.81 kg/m  General Appearance: Well nourished, in no apparent distress.  Eyes: PERRLA, EOMs, conjunctiva no swelling or erythema Sinuses: No Frontal/maxillary tenderness  ENT/Mouth: Ext aud canals clear, normal light reflex with TMs without erythema, bulging. Good dentition. No erythema, swelling, or exudate on post pharynx. Tonsils not swollen or erythematous. Hearing normal.  Neck: Supple, thyroid normal. No bruits  Respiratory: Respiratory effort normal, BS equal bilaterally without rales, rhonchi, wheezing or stridor.  Cardio: RRR without murmurs, rubs or gallops. Brisk peripheral pulses without edema.  Chest: symmetric, with normal excursions and percussion.  Breasts: Defer to GYN  Abdomen: Soft, nontender, no guarding, rebound, hernias, masses, or organomegaly.  Lymphatics: Non tender without lymphadenopathy.  Genitourinary: Defer to GYN Musculoskeletal: Full ROM all peripheral extremities,5/5 strength, and normal gait.  Skin: Warm, dry without rashes, ecchymosis. She has numerous small nevi, flat, <6 mm; two to back with irregular borders, unchanged from last year. Flat brown discoloration approx 2 cm area to left cheek.  Neuro: Cranial nerves intact, reflexes equal bilaterally. Normal muscle tone, no cerebellar symptoms. Sensation intact.  Psych: Awake and oriented X 3, normal affect, Insight and Judgment appropriate.     Paula Garcia 3:14 PM [facility]

## 2022-06-23 NOTE — Patient Instructions (Signed)

## 2022-06-24 LAB — CBC WITH DIFFERENTIAL/PLATELET
Absolute Monocytes: 582 cells/uL (ref 200–950)
Basophils Absolute: 57 cells/uL (ref 0–200)
Basophils Relative: 0.8 %
Eosinophils Absolute: 192 cells/uL (ref 15–500)
Eosinophils Relative: 2.7 %
HCT: 37 % (ref 35.0–45.0)
Hemoglobin: 12.4 g/dL (ref 11.7–15.5)
Lymphs Abs: 2343 cells/uL (ref 850–3900)
MCH: 29.4 pg (ref 27.0–33.0)
MCHC: 33.5 g/dL (ref 32.0–36.0)
MCV: 87.7 fL (ref 80.0–100.0)
MPV: 10 fL (ref 7.5–12.5)
Monocytes Relative: 8.2 %
Neutro Abs: 3926 cells/uL (ref 1500–7800)
Neutrophils Relative %: 55.3 %
Platelets: 318 10*3/uL (ref 140–400)
RBC: 4.22 10*6/uL (ref 3.80–5.10)
RDW: 12.2 % (ref 11.0–15.0)
Total Lymphocyte: 33 %
WBC: 7.1 10*3/uL (ref 3.8–10.8)

## 2022-06-24 LAB — HEMOGLOBIN A1C
Hgb A1c MFr Bld: 4.9 % of total Hgb (ref <5.7)
Mean Plasma Glucose: 94 mg/dL
eAG (mmol/L): 5.2 mmol/L

## 2022-06-24 LAB — MICROSCOPIC MESSAGE

## 2022-06-24 LAB — COMPLETE METABOLIC PANEL WITH GFR
AG Ratio: 2 (calc) (ref 1.0–2.5)
ALT: 23 U/L (ref 6–29)
AST: 17 U/L (ref 10–35)
Albumin: 4.6 g/dL (ref 3.6–5.1)
Alkaline phosphatase (APISO): 62 U/L (ref 31–125)
BUN: 12 mg/dL (ref 7–25)
CO2: 27 mmol/L (ref 20–32)
Calcium: 9.7 mg/dL (ref 8.6–10.2)
Chloride: 103 mmol/L (ref 98–110)
Creat: 0.85 mg/dL (ref 0.50–0.99)
Globulin: 2.3 g/dL (calc) (ref 1.9–3.7)
Glucose, Bld: 85 mg/dL (ref 65–99)
Potassium: 4.4 mmol/L (ref 3.5–5.3)
Sodium: 138 mmol/L (ref 135–146)
Total Bilirubin: 1.1 mg/dL (ref 0.2–1.2)
Total Protein: 6.9 g/dL (ref 6.1–8.1)
eGFR: 85 mL/min/{1.73_m2} (ref >=60)

## 2022-06-24 LAB — URINALYSIS, ROUTINE W REFLEX MICROSCOPIC
Bacteria, UA: NONE SEEN /HPF
Bilirubin Urine: NEGATIVE
Glucose, UA: NEGATIVE
Hgb urine dipstick: NEGATIVE
Hyaline Cast: NONE SEEN /LPF
Ketones, ur: NEGATIVE
Nitrite: NEGATIVE
Protein, ur: NEGATIVE
RBC / HPF: NONE SEEN /HPF (ref 0–2)
Specific Gravity, Urine: 1.004 (ref 1.001–1.035)
Squamous Epithelial / HPF: NONE SEEN /HPF (ref <=5)
WBC, UA: NONE SEEN /HPF (ref 0–5)
pH: 7 (ref 5.0–8.0)

## 2022-06-24 LAB — LIPID PANEL
Cholesterol: 191 mg/dL (ref <200)
HDL: 60 mg/dL (ref >=50)
LDL Cholesterol (Calc): 106 mg/dL (calc) — ABNORMAL HIGH
Non-HDL Cholesterol (Calc): 131 mg/dL (calc) — ABNORMAL HIGH (ref <130)
Total CHOL/HDL Ratio: 3.2 (calc) (ref <5.0)
Triglycerides: 139 mg/dL (ref <150)

## 2022-06-24 LAB — TSH: TSH: 2.2 mIU/L

## 2022-06-24 LAB — MAGNESIUM: Magnesium: 2.1 mg/dL (ref 1.5–2.5)

## 2022-06-24 LAB — VITAMIN D 25 HYDROXY (VIT D DEFICIENCY, FRACTURES): Vit D, 25-Hydroxy: 57 ng/mL (ref 30–100)

## 2022-07-01 ENCOUNTER — Encounter: Payer: Self-pay | Admitting: Nurse Practitioner

## 2022-07-01 DIAGNOSIS — Z86018 Personal history of other benign neoplasm: Secondary | ICD-10-CM

## 2022-08-06 ENCOUNTER — Encounter: Payer: Self-pay | Admitting: Nurse Practitioner

## 2022-08-06 DIAGNOSIS — R928 Other abnormal and inconclusive findings on diagnostic imaging of breast: Secondary | ICD-10-CM

## 2022-08-13 ENCOUNTER — Other Ambulatory Visit: Payer: Self-pay | Admitting: Nurse Practitioner

## 2022-08-13 DIAGNOSIS — R195 Other fecal abnormalities: Secondary | ICD-10-CM

## 2022-08-13 LAB — COLOGUARD: COLOGUARD: POSITIVE — AB

## 2022-08-17 ENCOUNTER — Encounter: Payer: Self-pay | Admitting: Internal Medicine

## 2022-08-17 LAB — HM MAMMOGRAPHY

## 2022-09-01 ENCOUNTER — Ambulatory Visit (AMBULATORY_SURGERY_CENTER): Payer: Self-pay

## 2022-09-01 VITALS — Ht 65.5 in | Wt 198.0 lb

## 2022-09-01 DIAGNOSIS — Z1211 Encounter for screening for malignant neoplasm of colon: Secondary | ICD-10-CM

## 2022-09-01 DIAGNOSIS — R195 Other fecal abnormalities: Secondary | ICD-10-CM

## 2022-09-01 MED ORDER — NA SULFATE-K SULFATE-MG SULF 17.5-3.13-1.6 GM/177ML PO SOLN: 1.0000 | Freq: Once | ORAL | 0 refills | Status: AC

## 2022-09-01 NOTE — Progress Notes (Signed)
No egg or soy allergy known to patient  No issues known to pt with past sedation with any surgeries or procedures--- Patient denies ever being told they had issues or difficulty with intubation --- No FH of Malignant Hyperthermia Pt is not on diet pills Pt is not on home 02  Pt is not on blood thinners  Pt denies issues with constipation  No A fib or A flutter Have any cardiac testing pending--NO Pt instructed to use [email or web address] or GoodRx for a price reduction on prep  Insurance verified during PV appt=[employer]

## 2022-09-06 ENCOUNTER — Encounter: Payer: Self-pay | Admitting: Internal Medicine

## 2022-09-14 ENCOUNTER — Ambulatory Visit (AMBULATORY_SURGERY_CENTER): Payer: 59 | Admitting: Internal Medicine

## 2022-09-14 ENCOUNTER — Encounter: Payer: Self-pay | Admitting: Internal Medicine

## 2022-09-14 VITALS — BP 100/60 | HR 67 | Temp 96.4°F | Resp 14 | Ht 65.5 in | Wt 198.0 lb

## 2022-09-14 DIAGNOSIS — R195 Other fecal abnormalities: Secondary | ICD-10-CM

## 2022-09-14 DIAGNOSIS — Z1211 Encounter for screening for malignant neoplasm of colon: Secondary | ICD-10-CM

## 2022-09-14 HISTORY — PX: COLONOSCOPY: SHX174

## 2022-09-14 MED ORDER — SODIUM CHLORIDE 0.9 % IV SOLN: 500.0000 mL | Freq: Once | INTRAVENOUS | Status: DC

## 2022-09-14 NOTE — Op Note (Signed)
[facility] Patient Name: Paula Garcia Procedure Date: 09/14/2022 9:20 AM MRN: [id number] Endoscopist: Docia Chuck. Henrene Pastor , MD Age: 46 Referring MD:  Date of Birth: 1976/04/25 Gender: Female Account #: [id number] Procedure:                Colonoscopy Indications:              Positive Cologuard test. Colon cancer screening Medicines:                Monitored Anesthesia Care Procedure:                Pre-Anesthesia Assessment:                           - Prior to the procedure, a History and Physical                            was performed, and patient medications and                            allergies were reviewed. The patient's tolerance of                            previous anesthesia was also reviewed. The risks                            and benefits of the procedure and the sedation                            options and risks were discussed with the patient.                            All questions were answered, and informed consent                            was obtained. Prior Anticoagulants: The patient has                            taken no previous anticoagulant or antiplatelet                            agents. ASA Grade Assessment: II - A patient with                            mild systemic disease. After reviewing the risks                            and benefits, the patient was deemed in                            satisfactory condition to undergo the procedure.                           After obtaining informed consent, the colonoscope  was passed under direct vision. Throughout the                            procedure, the patient's blood pressure, pulse, and                            oxygen saturations were monitored continuously. The                            CF [id number] #[id number] was introduced through the anus                            and advanced to the the cecum, identified by                            appendiceal  orifice and ileocecal valve. The                            ileocecal valve, appendiceal orifice, and rectum                            were photographed. The quality of the bowel                            preparation was excellent. The colonoscopy was                            performed without difficulty. The patient tolerated                            the procedure well. The bowel preparation used was                            SUPREP via split dose instruction. Scope In: 9:34:17 AM Scope Out: 9:48:21 AM Scope Withdrawal Time: 0 hours 12 minutes 0 seconds  Total Procedure Duration: 0 hours 14 minutes 4 seconds  Findings:                 The entire examined colon appeared normal on direct                            and retroflexion views. Complications:            No immediate complications. Estimated blood loss:                            None. Estimated Blood Loss:     Estimated blood loss: none. Impression:               - The entire examined colon is normal on direct and                            retroflexion views.                           - No  specimens collected. Recommendation:           - Repeat colonoscopy in 10 years for screening                            purposes.                           - Patient has a contact number available for                            emergencies. The signs and symptoms of potential                            delayed complications were discussed with the                            patient. Return to normal activities tomorrow.                            Written discharge instructions were provided to the                            patient.                           - Resume previous diet.                           - Continue present medications. Docia Chuck. Henrene Pastor, MD 09/14/2022 9:54:08 AM This report has been signed electronically.

## 2022-09-14 NOTE — Progress Notes (Signed)
Pt's states no medical or surgical changes since previsit or office visit. 

## 2022-09-14 NOTE — Progress Notes (Signed)
HISTORY OF PRESENT ILLNESS:  Paula Garcia is a 46 y.o. female who is sent today for colonoscopy after positive Cologuard testing August 09, 2022  REVIEW OF SYSTEMS:  All non-GI ROS negative.  Past Medical History:  Diagnosis Date   Anxiety    on meds   DDD (degenerative disc disease)    Endometriosis    Seasonal allergies     Past Surgical History:  Procedure Laterality Date   CERVICAL FUSION  2012   C3-C4  C5-C6   CESAREAN SECTION  2010   COLPOSCOPY     age 81   LAPAROSCOPIC ABDOMINAL EXPLORATION  2004,2005   due to endometriosis   WISDOM TOOTH EXTRACTION      Social History Paula Garcia  reports that she has never smoked. She has never used smokeless tobacco. She reports current alcohol use. She reports that she does not use drugs.  family history includes Brain cancer in her paternal grandmother; COPD in her father; Cancer in her mother; Congenital Murmur in her son; Heart disease in her father; Stroke in her maternal grandfather; Throat cancer in her maternal aunt.  No Known Allergies     PHYSICAL EXAMINATION: Vital signs: BP 124/73   Pulse 81   Temp (!) 96.4 F (35.8 C)   Ht 5' 5.5" (1.664 m)   Wt 198 lb (89.8 kg)   LMP 09/08/2022   SpO2 100%   BMI 32.45 kg/m  General: Well-developed, well-nourished, no acute distress HEENT: Sclerae are anicteric, conjunctiva pink. Oral mucosa intact Lungs: Clear Heart: Regular Abdomen: soft, nontender, nondistended, no obvious ascites, no peritoneal signs, normal bowel sounds. No organomegaly. Extremities: No edema Psychiatric: alert and oriented x3. Cooperative      ASSESSMENT:  Positive Cologuard testing   PLAN:   Colonoscopy

## 2022-09-14 NOTE — Patient Instructions (Signed)
Resume previous diet and medications. Repeat Colonoscopy in 10 years for surveillance.  YOU HAD AN ENDOSCOPIC PROCEDURE TODAY AT THE [facility]:   Refer to the procedure report that was given to you for any specific questions about what was found during the examination.  If the procedure report does not answer your questions, please call your gastroenterologist to clarify.  If you requested that your care partner not be given the details of your procedure findings, then the procedure report has been included in a sealed envelope for you to review at your convenience later.  YOU SHOULD EXPECT: Some feelings of bloating in the abdomen. Passage of more gas than usual.  Walking can help get rid of the air that was put into your GI tract during the procedure and reduce the bloating. If you had a lower endoscopy (such as a colonoscopy or flexible sigmoidoscopy) you may notice spotting of blood in your stool or on the toilet paper. If you underwent a bowel prep for your procedure, you may not have a normal bowel movement for a few days.  Please Note:  You might notice some irritation and congestion in your nose or some drainage.  This is from the oxygen used during your procedure.  There is no need for concern and it should clear up in a day or so.  SYMPTOMS TO REPORT IMMEDIATELY:  Following lower endoscopy (colonoscopy or flexible sigmoidoscopy):  Excessive amounts of blood in the stool  Significant tenderness or worsening of abdominal pains  Swelling of the abdomen that is new, acute  Fever of 100F or higher   For urgent or emergent issues, a gastroenterologist can be reached at any hour by calling [phone]. Do not use MyChart messaging for urgent concerns.    DIET:  We do recommend a small meal at first, but then you may proceed to your regular diet.  Drink plenty of fluids but you should avoid alcoholic beverages for 24 hours.  ACTIVITY:  You should plan to take it easy for  the rest of today and you should NOT DRIVE or use heavy machinery until tomorrow (because of the sedation medicines used during the test).    FOLLOW UP: Our staff will call the number listed on your records the next business day following your procedure.  We will call around 7:15- 8:00 am to check on you and address any questions or concerns that you may have regarding the information given to you following your procedure. If we do not reach you, we will leave a message.     If any biopsies were taken you will be contacted by phone or by letter within the next 1-3 weeks.  Please call us at [phone] if you have not heard about the biopsies in 3 weeks.    SIGNATURES/CONFIDENTIALITY: You and/or your care partner have signed paperwork which will be entered into your electronic medical record.  These signatures attest to the fact that that the information above on your After Visit Summary has been reviewed and is understood.  Full responsibility of the confidentiality of this discharge information lies with you and/or your care-partner. 

## 2022-09-15 ENCOUNTER — Telehealth: Payer: Self-pay | Admitting: *Deleted

## 2022-09-15 ENCOUNTER — Encounter: Payer: Self-pay | Admitting: Internal Medicine

## 2022-09-15 NOTE — Telephone Encounter (Signed)
Left message on f/u call 

## 2022-09-23 ENCOUNTER — Encounter: Payer: Self-pay | Admitting: Internal Medicine

## 2022-11-03 ENCOUNTER — Encounter (HOSPITAL_BASED_OUTPATIENT_CLINIC_OR_DEPARTMENT_OTHER): Payer: Self-pay | Admitting: Obstetrics and Gynecology

## 2022-11-03 ENCOUNTER — Other Ambulatory Visit: Payer: Self-pay

## 2022-11-03 DIAGNOSIS — D251 Intramural leiomyoma of uterus: Secondary | ICD-10-CM | POA: Diagnosis not present

## 2022-11-03 DIAGNOSIS — Z01812 Encounter for preprocedural laboratory examination: Secondary | ICD-10-CM | POA: Diagnosis present

## 2022-11-03 NOTE — Progress Notes (Signed)
Your procedure is scheduled on Thursday, 11/11/2022.  Report to [facility].   Call this number if you have problems the morning of surgery  [phone].   OUR ADDRESS IS [place].  WE ARE LOCATED IN [facility].  PLEASE BRING YOUR INSURANCE CARD AND PHOTO ID DAY OF SURGERY.  ONLY 2 PEOPLE ARE ALLOWED IN  WAITING  ROOM.                                      REMEMBER:  DO NOT EAT FOOD, CANDY GUM OR MINTS  AFTER MIDNIGHT THE NIGHT BEFORE YOUR SURGERY . YOU MAY HAVE CLEAR LIQUIDS FROM MIDNIGHT THE NIGHT BEFORE YOUR SURGERY UNTIL  4:30 AM. NO CLEAR LIQUIDS AFTER   4:30 AM DAY OF SURGERY.  YOU MAY  BRUSH YOUR TEETH MORNING OF SURGERY AND RINSE YOUR MOUTH OUT, NO CHEWING GUM CANDY OR MINTS.     CLEAR LIQUID DIET   Foods Allowed                                                                     Foods Excluded  Coffee and tea, regular and decaf                             liquids that you cannot  Plain Jell-O                                                                   see through such as: Fruit ices (not with fruit pulp)                                     milk, soups, orange juice  Plain  Popsicles                                    All solid food Carbonated beverages, regular and diet                                    Cranberry, grape and apple juices Sports drinks like Gatorade _____________________________________________________________________     TAKE ONLY THESE MEDICATIONS MORNING OF SURGERY: Zyrtec, Valtrex if needed    UP TO 4 VISITORS  MAY VISIT IN THE EXTENDED RECOVERY ROOM UNTIL 800 PM ONLY.  ONE  VISITOR AGE 9 AND OVER MAY SPEND THE NIGHT AND MUST BE IN EXTENDED RECOVERY ROOM NO LATER THAN 800 PM . YOUR DISCHARGE TIME AFTER YOU SPEND THE NIGHT IS 900 AM THE MORNING AFTER YOUR SURGERY.  YOU MAY PACK A SMALL OVERNIGHT BAG WITH TOILETRIES FOR  YOUR OVERNIGHT STAY IF YOU WISH.  YOUR PRESCRIPTION  MEDICATIONS WILL BE PROVIDED DURING [place].                                      DO NOT WEAR JEWERLY, MAKE UP. DO NOT WEAR LOTIONS, POWDERS, PERFUMES OR NAIL POLISH ON YOUR FINGERNAILS. TOENAIL POLISH IS OK TO WEAR. DO NOT SHAVE FOR 48 HOURS PRIOR TO DAY OF SURGERY. MEN MAY SHAVE FACE AND NECK. CONTACTS, GLASSES, OR DENTURES MAY NOT BE WORN TO SURGERY.  REMEMBER: NO SMOKING, DRUGS OR ALCOHOL FOR 24 HOURS BEFORE YOUR SURGERY.                                    [facility] IS NOT RESPONSIBLE  FOR ANY BELONGINGS.                                                                    Marland Kitchen            - Preparing for Surgery Before surgery, you can play an important role.  Because skin is not sterile, your skin needs to be as free of germs as possible.  You can reduce the number of germs on your skin by washing with CHG (chlorahexidine gluconate) soap before surgery.  CHG is an antiseptic cleaner which kills germs and bonds with the skin to continue killing germs even after washing. Please DO NOT use if you have an allergy to CHG or antibacterial soaps.  If your skin becomes reddened/irritated stop using the CHG and inform your nurse when you arrive at [facility]. Do not shave (including legs and underarms) for at least 48 hours prior to the first CHG shower.  You may shave your face/neck. Please follow these instructions carefully:  1.  Shower with CHG Soap the night before surgery and the  morning of Surgery.  2.  If you choose to wash your hair, wash your hair first as usual with your  normal  shampoo.  3.  After you shampoo, rinse your hair and body thoroughly to remove the  shampoo.                                        4.  Use CHG as you would any other liquid soap.  You can apply chg directly  to the skin and wash , chg soap provided, night before and morning of your surgery.  5.  Apply the CHG Soap to your body ONLY FROM THE NECK DOWN.   Do not use on face/ open                            Wound or open sores. Avoid contact with eyes, ears mouth and genitals (private parts).                       Wash face,  Genitals (private parts) with your normal  soap.             6.  Wash thoroughly, paying special attention to the area where your surgery  will be performed.  7.  Thoroughly rinse your body with warm water from the neck down.  8.  DO NOT shower/wash with your normal soap after using and rinsing off  the CHG Soap.             9.  Pat yourself dry with a clean towel.            10.  Wear clean pajamas.            11.  Place clean sheets on your bed the night of your first shower and do not  sleep with pets. Day of Surgery : Do not apply any lotions/deodorants the morning of surgery.  Please wear clean clothes to the hospital/surgery center.  IF YOU HAVE ANY SKIN IRRITATION OR PROBLEMS WITH THE SURGICAL SOAP, PLEASE GET A BAR OF GOLD DIAL SOAP AND SHOWER THE NIGHT BEFORE YOUR SURGERY AND THE MORNING OF YOUR SURGERY. PLEASE LET THE NURSE KNOW MORNING OF YOUR SURGERY IF YOU HAD ANY PROBLEMS WITH THE SURGICAL SOAP.   ________________________________________________________________________                                                        QUESTIONS [facility] PRE OP NURSE PHONE [phone].

## 2022-11-03 NOTE — Progress Notes (Addendum)
Spoke w/ via phone for pre-op interview---[facility] needs dos---- urine pregnancy              Lab results------11/08/22 lab appt for cbc, type & screen, 06/23/22 EKG in chart & Epic COVID test -----patient states asymptomatic no test needed Arrive at -------0530 on Thursday, 11/11/22 NPO after MN NO Solid Food.  Clear liquids from MN until---0430 Med rec completed Medications to take morning of surgery -----Zyrtec, Valtrex prn Diabetic medication -----n/a Patient instructed no nail polish to be worn day of surgery Patient instructed to bring photo id and insurance card day of surgery Patient aware to have Driver (ride ) / caregiver    for 24 hours after surgery - husband, Paula Garcia Patient Special Instructions -----Extended / overnight stay instructions given. Pre-Op special Istructions -----none Patient verbalized understanding of instructions that were given at this phone interview. Patient denies shortness of breath, chest pain, fever, cough at this phone interview.

## 2022-11-08 ENCOUNTER — Encounter (HOSPITAL_COMMUNITY)
Admission: RE | Admit: 2022-11-08 | Discharge: 2022-11-08 | Disposition: A | Discharge status: Home / Self Care | Payer: 59 | Source: Ambulatory Visit | Attending: Obstetrics and Gynecology | Admitting: Obstetrics and Gynecology

## 2022-11-08 DIAGNOSIS — Z01812 Encounter for preprocedural laboratory examination: Secondary | ICD-10-CM | POA: Insufficient documentation

## 2022-11-08 DIAGNOSIS — D251 Intramural leiomyoma of uterus: Secondary | ICD-10-CM | POA: Insufficient documentation

## 2022-11-08 LAB — CBC
HCT: 38.9 % (ref 36.0–46.0)
Hemoglobin: 13.3 g/dL (ref 12.0–15.0)
MCH: 29.2 pg (ref 26.0–34.0)
MCHC: 34.2 g/dL (ref 30.0–36.0)
MCV: 85.3 fL (ref 80.0–100.0)
Platelets: 247 10*3/uL (ref 150–400)
RBC: 4.56 MIL/uL (ref 3.87–5.11)
RDW: 12.9 % (ref 11.5–15.5)
WBC: 6.4 10*3/uL (ref 4.0–10.5)
nRBC: 0 % (ref 0.0–0.2)

## 2022-11-10 NOTE — Anesthesia Preprocedure Evaluation (Addendum)
Anesthesia Evaluation  Patient identified by MRN, date of birth, ID band Patient awake    Reviewed: Allergy & Precautions, NPO status , Patient's Chart, lab work & pertinent test results  History of Anesthesia Complications Negative for: history of anesthetic complications  Airway Mallampati: II  TM Distance: >3 FB Neck ROM: Full    Dental no notable dental hx. (+) Dental Advisory Given   Pulmonary neg pulmonary ROS   Pulmonary exam normal        Cardiovascular negative cardio ROS Normal cardiovascular exam     Neuro/Psych negative neurological ROS     GI/Hepatic negative GI ROS, Neg liver ROS,,,  Endo/Other  negative endocrine ROS    Renal/GU negative Renal ROS     Musculoskeletal  (+) Arthritis ,    Abdominal   Peds  Hematology negative hematology ROS (+)   Anesthesia Other Findings symptomatic myomatous uterus  Reproductive/Obstetrics                             Anesthesia Physical Anesthesia Plan  ASA: 2  Anesthesia Plan: General   Post-op Pain Management: Tylenol PO (pre-op)* and Toradol IV (intra-op)*   Induction: Intravenous  PONV Risk Score and Plan: 4 or greater and Ondansetron, Dexamethasone, Midazolam and Scopolamine patch - Pre-op  Airway Management Planned: Oral ETT  Additional Equipment:   Intra-op Plan:   Post-operative Plan: Extubation in OR  Informed Consent: I have reviewed the patients History and Physical, chart, labs and discussed the procedure including the risks, benefits and alternatives for the proposed anesthesia with the patient or authorized representative who has indicated his/her understanding and acceptance.     Dental advisory given  Plan Discussed with: Anesthesiologist and CRNA  Anesthesia Plan Comments:        Anesthesia Quick Evaluation

## 2022-11-10 NOTE — H&P (Signed)
Paula Garcia is an 46 y.o. female. She was seen for annual exam in September.  At that time, she was having heavy, irreguar menses despite Mirena.  Exam revealed a 10-12 week size irregular uterus.  Pelvic ultrasound 2 weeks later with at least 7 measurable myomas, largest 4+ cm.  At that time her IUD was removed, options discussed, and she initially chose to try Lysteda with menses.  However, after further consideration she wants to proceed with definitive surgical management with hysterectomy.  She also has had 2 previous laparoscopies for endometriosis and adhesions  Pertinent Gynecological History: Last mammogram: abnormal: normal follow-up  Date: 07/2022 Last pap: normal Date: 2020 OB History: G1, P1001 LTCS   Menstrual History: Patient's last menstrual period was 09/05/2022 (approximate).    Past Medical History:  Diagnosis Date   Anxiety    11/03/22 no longer taking Buspar, follows with PCP.   COVID-19 2021   cold-like symptoms with persistent HA   DDD (degenerative disc disease)    S1 radiculopathy by 09/06/2019 nerve conduction study in Epic, bilateral leg numbness   Endometriosis    Seasonal allergies    Uterine leiomyoma     Past Surgical History:  Procedure Laterality Date   CERVICAL FUSION  2012   C3-C4  C5-C6   CESAREAN SECTION  2010   COLONOSCOPY  09/14/2022   Patient had a positive cologuard test. Colonoscopy was normal.   COLPOSCOPY     age 5   [place]  2004,2005   due to endometriosis   [place]    Family History  Problem Relation Age of Onset   Cancer Mother        Skin, nonmelanoma   Heart disease Father    COPD Father        smoker   Throat cancer Maternal Aunt        throat   Stroke Maternal Grandfather        19s   Brain cancer Paternal Grandmother    Congenital Murmur Son    Colon cancer Neg Hx    Esophageal cancer Neg Hx    Stomach cancer Neg Hx    Rectal cancer Neg Hx    Colon polyps  Neg Hx     Social History:  reports that she has never smoked. She has never used smokeless tobacco. She reports current alcohol use of about 5.0 standard drinks of alcohol per week. She reports that she does not use drugs.  Allergies: No Known Allergies  No medications prior to admission.    Review of Systems  Respiratory: Negative.    Cardiovascular: Negative.     Height '5\' 6"'$  (1.676 m), weight 90.7 kg, last menstrual period 09/05/2022. Physical Exam Constitutional:      Appearance: Normal appearance.  Cardiovascular:     Rate and Rhythm: Normal rate and regular rhythm.     Heart sounds: Normal heart sounds. No murmur heard. Pulmonary:     Effort: Pulmonary effort is normal. No respiratory distress.     Breath sounds: Normal breath sounds.  Abdominal:     General: There is no distension.     Palpations: Abdomen is soft. There is no mass.     Tenderness: There is no abdominal tenderness.  Genitourinary:    General: Normal vulva.     Comments: Uterus 10-12 weeks size and irregular No adnexal mass Musculoskeletal:     Cervical back: Normal range of motion and neck supple.  Neurological:  Mental Status: She is alert.     No results found for this or any previous visit (from the past 24 hour(s)).  No results found.  Assessment/Plan: Symptomatic myomatous uterus.  All medical and surgical options have been discussed, questions answered.  Surgical procedure, risks, alternatives, chances of relieving her symptoms all discussed, questions answered.  Will admit for TLH, bilateral salpingectomy, cystoscopy.  Blane Ohara Cailee Blanke 11/10/2022, 7:13 PM

## 2022-11-11 ENCOUNTER — Encounter (HOSPITAL_BASED_OUTPATIENT_CLINIC_OR_DEPARTMENT_OTHER)
Admission: RE | Disposition: A | Discharge status: Home / Self Care | Payer: Self-pay | Source: Home / Self Care | Attending: Obstetrics and Gynecology

## 2022-11-11 ENCOUNTER — Ambulatory Visit (HOSPITAL_BASED_OUTPATIENT_CLINIC_OR_DEPARTMENT_OTHER): Payer: 59 | Admitting: Anesthesiology

## 2022-11-11 ENCOUNTER — Encounter (HOSPITAL_BASED_OUTPATIENT_CLINIC_OR_DEPARTMENT_OTHER): Payer: Self-pay | Admitting: Obstetrics and Gynecology

## 2022-11-11 ENCOUNTER — Other Ambulatory Visit: Payer: Self-pay

## 2022-11-11 ENCOUNTER — Ambulatory Visit (HOSPITAL_BASED_OUTPATIENT_CLINIC_OR_DEPARTMENT_OTHER)
Admission: RE | Admit: 2022-11-11 | Discharge: 2022-11-11 | Disposition: A | Discharge status: Home / Self Care | Payer: 59 | Attending: Obstetrics and Gynecology | Admitting: Obstetrics and Gynecology

## 2022-11-11 DIAGNOSIS — N80121 Deep endometriosis of right ovary: Secondary | ICD-10-CM | POA: Diagnosis not present

## 2022-11-11 DIAGNOSIS — D251 Intramural leiomyoma of uterus: Secondary | ICD-10-CM | POA: Insufficient documentation

## 2022-11-11 DIAGNOSIS — D259 Leiomyoma of uterus, unspecified: Secondary | ICD-10-CM

## 2022-11-11 DIAGNOSIS — N83201 Unspecified ovarian cyst, right side: Secondary | ICD-10-CM | POA: Diagnosis not present

## 2022-11-11 DIAGNOSIS — N736 Female pelvic peritoneal adhesions (postinfective): Secondary | ICD-10-CM | POA: Diagnosis not present

## 2022-11-11 DIAGNOSIS — Z01818 Encounter for other preprocedural examination: Secondary | ICD-10-CM

## 2022-11-11 DIAGNOSIS — N8003 Adenomyosis of the uterus: Secondary | ICD-10-CM | POA: Insufficient documentation

## 2022-11-11 DIAGNOSIS — Z90710 Acquired absence of both cervix and uterus: Secondary | ICD-10-CM | POA: Diagnosis present

## 2022-11-11 HISTORY — PX: TOTAL LAPAROSCOPIC HYSTERECTOMY WITH SALPINGECTOMY: SHX6742

## 2022-11-11 HISTORY — PX: CYSTOSCOPY: SHX5120

## 2022-11-11 HISTORY — DX: Leiomyoma of uterus, unspecified: D25.9

## 2022-11-11 LAB — TYPE AND SCREEN
ABO/RH(D): B NEG
Antibody Screen: NEGATIVE

## 2022-11-11 LAB — POCT PREGNANCY, URINE: Preg Test, Ur: NEGATIVE

## 2022-11-11 SURGERY — HYSTERECTOMY, TOTAL, LAPAROSCOPIC, WITH SALPINGECTOMY
Anesthesia: General | Site: Urethra

## 2022-11-11 MED ORDER — FENTANYL CITRATE (PF) 100 MCG/2ML IJ SOLN
25.0000 ug | INTRAMUSCULAR | Status: DC | PRN
  Administered 2022-11-11: 50 ug via INTRAVENOUS

## 2022-11-11 MED ORDER — ACETAMINOPHEN 500 MG PO TABS
ORAL_TABLET | ORAL | Status: AC
  Filled 2022-11-11: qty 2

## 2022-11-11 MED ORDER — OXYCODONE HCL 5 MG PO TABS
5.0000 mg | ORAL_TABLET | ORAL | Status: DC | PRN
  Administered 2022-11-11: 5 mg via ORAL

## 2022-11-11 MED ORDER — FENTANYL CITRATE (PF) 250 MCG/5ML IJ SOLN
INTRAMUSCULAR | Status: AC
  Filled 2022-11-11: qty 5

## 2022-11-11 MED ORDER — DEXAMETHASONE SODIUM PHOSPHATE 10 MG/ML IJ SOLN
INTRAMUSCULAR | Status: AC
  Filled 2022-11-11: qty 1

## 2022-11-11 MED ORDER — GABAPENTIN 300 MG PO CAPS
ORAL_CAPSULE | ORAL | Status: AC
  Filled 2022-11-11: qty 1

## 2022-11-11 MED ORDER — CEFAZOLIN SODIUM-DEXTROSE 2-4 GM/100ML-% IV SOLN
2.0000 g | INTRAVENOUS | Status: AC
  Administered 2022-11-11: 2 g via INTRAVENOUS

## 2022-11-11 MED ORDER — FENTANYL CITRATE (PF) 100 MCG/2ML IJ SOLN
INTRAMUSCULAR | Status: AC
  Filled 2022-11-11: qty 2

## 2022-11-11 MED ORDER — BISACODYL 10 MG RE SUPP: 10.0000 mg | Freq: Every day | RECTAL | Status: DC | PRN

## 2022-11-11 MED ORDER — KETOROLAC TROMETHAMINE 30 MG/ML IJ SOLN
INTRAMUSCULAR | Status: DC | PRN
  Administered 2022-11-11: 30 mg via INTRAVENOUS

## 2022-11-11 MED ORDER — LIDOCAINE HCL (PF) 2 % IJ SOLN
INTRAMUSCULAR | Status: AC
  Filled 2022-11-11: qty 5

## 2022-11-11 MED ORDER — DEXAMETHASONE SODIUM PHOSPHATE 4 MG/ML IJ SOLN
INTRAMUSCULAR | Status: DC | PRN
  Administered 2022-11-11: 5 mg via INTRAVENOUS

## 2022-11-11 MED ORDER — GABAPENTIN 300 MG PO CAPS
300.0000 mg | ORAL_CAPSULE | Freq: Three times a day (TID) | ORAL | Status: DC
  Administered 2022-11-11: 300 mg via ORAL

## 2022-11-11 MED ORDER — PHENYLEPHRINE HCL (PRESSORS) 10 MG/ML IV SOLN
INTRAVENOUS | Status: DC | PRN
  Administered 2022-11-11 (×2): 80 ug via INTRAVENOUS

## 2022-11-11 MED ORDER — MIDAZOLAM HCL 2 MG/2ML IJ SOLN
INTRAMUSCULAR | Status: AC
  Filled 2022-11-11: qty 2

## 2022-11-11 MED ORDER — SCOPOLAMINE 1 MG/3DAYS TD PT72
MEDICATED_PATCH | TRANSDERMAL | Status: AC
  Filled 2022-11-11: qty 1

## 2022-11-11 MED ORDER — GABAPENTIN 300 MG PO CAPS
300.0000 mg | ORAL_CAPSULE | ORAL | Status: AC
  Administered 2022-11-11: 300 mg via ORAL

## 2022-11-11 MED ORDER — LACTATED RINGERS IV SOLN: INTRAVENOUS | Status: DC

## 2022-11-11 MED ORDER — SIMETHICONE 80 MG PO CHEW: 80.0000 mg | CHEWABLE_TABLET | Freq: Four times a day (QID) | ORAL | Status: DC | PRN

## 2022-11-11 MED ORDER — ONDANSETRON HCL 4 MG/2ML IJ SOLN
INTRAMUSCULAR | Status: AC
  Filled 2022-11-11: qty 2

## 2022-11-11 MED ORDER — KETOROLAC TROMETHAMINE 30 MG/ML IJ SOLN
INTRAMUSCULAR | Status: AC
  Filled 2022-11-11: qty 1

## 2022-11-11 MED ORDER — ONDANSETRON HCL 4 MG/2ML IJ SOLN
INTRAMUSCULAR | Status: DC | PRN
  Administered 2022-11-11: 4 mg via INTRAVENOUS

## 2022-11-11 MED ORDER — BUPIVACAINE HCL 0.5 % IJ SOLN
INTRAMUSCULAR | Status: DC | PRN
  Administered 2022-11-11: 9 mL

## 2022-11-11 MED ORDER — IBUPROFEN 600 MG PO TABS: 600.0000 mg | ORAL_TABLET | Freq: Four times a day (QID) | ORAL | 0 refills | Status: AC

## 2022-11-11 MED ORDER — PROMETHAZINE HCL 25 MG/ML IJ SOLN: 6.2500 mg | INTRAMUSCULAR | Status: DC | PRN

## 2022-11-11 MED ORDER — OXYCODONE HCL 5 MG PO TABS
ORAL_TABLET | ORAL | Status: AC
  Filled 2022-11-11: qty 1

## 2022-11-11 MED ORDER — ALUM & MAG HYDROXIDE-SIMETH 200-200-20 MG/5ML PO SUSP: 30.0000 mL | ORAL | Status: DC | PRN

## 2022-11-11 MED ORDER — SCOPOLAMINE 1 MG/3DAYS TD PT72
1.0000 | MEDICATED_PATCH | TRANSDERMAL | Status: DC
  Administered 2022-11-11: 1.5 mg via TRANSDERMAL

## 2022-11-11 MED ORDER — KETOROLAC TROMETHAMINE 30 MG/ML IJ SOLN
30.0000 mg | Freq: Four times a day (QID) | INTRAMUSCULAR | Status: DC
  Administered 2022-11-11: 30 mg via INTRAVENOUS

## 2022-11-11 MED ORDER — SUGAMMADEX SODIUM 200 MG/2ML IV SOLN
INTRAVENOUS | Status: DC | PRN
  Administered 2022-11-11: 200 mg via INTRAVENOUS

## 2022-11-11 MED ORDER — ROCURONIUM BROMIDE 10 MG/ML (PF) SYRINGE
PREFILLED_SYRINGE | INTRAVENOUS | Status: AC
  Filled 2022-11-11: qty 10

## 2022-11-11 MED ORDER — BUPIVACAINE HCL (PF) 0.25 % IJ SOLN
INTRAMUSCULAR | Status: DC | PRN
  Administered 2022-11-11: 10 mL

## 2022-11-11 MED ORDER — ONDANSETRON HCL 4 MG/2ML IJ SOLN
4.0000 mg | Freq: Four times a day (QID) | INTRAMUSCULAR | Status: DC | PRN
  Administered 2022-11-11: 4 mg via INTRAVENOUS

## 2022-11-11 MED ORDER — AMISULPRIDE (ANTIEMETIC) 5 MG/2ML IV SOLN
10.0000 mg | Freq: Once | INTRAVENOUS | Status: AC | PRN
  Administered 2022-11-11: 10 mg via INTRAVENOUS

## 2022-11-11 MED ORDER — ONDANSETRON HCL 4 MG PO TABS: 4.0000 mg | ORAL_TABLET | Freq: Four times a day (QID) | ORAL | Status: DC | PRN

## 2022-11-11 MED ORDER — FENTANYL CITRATE (PF) 100 MCG/2ML IJ SOLN
INTRAMUSCULAR | Status: DC | PRN
  Administered 2022-11-11: 50 ug via INTRAVENOUS
  Administered 2022-11-11: 100 ug via INTRAVENOUS
  Administered 2022-11-11 (×4): 50 ug via INTRAVENOUS

## 2022-11-11 MED ORDER — ACETAMINOPHEN 500 MG PO TABS
1000.0000 mg | ORAL_TABLET | Freq: Four times a day (QID) | ORAL | Status: DC
  Administered 2022-11-11: 1000 mg via ORAL

## 2022-11-11 MED ORDER — GABAPENTIN 300 MG PO CAPS: 300.0000 mg | ORAL_CAPSULE | Freq: Three times a day (TID) | ORAL | 0 refills | Status: AC

## 2022-11-11 MED ORDER — PROPOFOL 10 MG/ML IV BOLUS
INTRAVENOUS | Status: DC | PRN
  Administered 2022-11-11: 200 mg via INTRAVENOUS

## 2022-11-11 MED ORDER — AMISULPRIDE (ANTIEMETIC) 5 MG/2ML IV SOLN
INTRAVENOUS | Status: AC
  Filled 2022-11-11: qty 2

## 2022-11-11 MED ORDER — HYDROMORPHONE HCL 1 MG/ML IJ SOLN: 1.0000 mg | INTRAMUSCULAR | Status: DC | PRN

## 2022-11-11 MED ORDER — CEFAZOLIN SODIUM-DEXTROSE 2-4 GM/100ML-% IV SOLN
INTRAVENOUS | Status: AC
  Filled 2022-11-11: qty 100

## 2022-11-11 MED ORDER — POVIDONE-IODINE 10 % EX SWAB: 2.0000 | Freq: Once | CUTANEOUS | Status: DC

## 2022-11-11 MED ORDER — 0.9 % SODIUM CHLORIDE (POUR BTL) OPTIME
TOPICAL | Status: DC | PRN
  Administered 2022-11-11: 500 mL

## 2022-11-11 MED ORDER — PROPOFOL 10 MG/ML IV BOLUS
INTRAVENOUS | Status: AC
  Filled 2022-11-11: qty 20

## 2022-11-11 MED ORDER — MENTHOL 3 MG MT LOZG: 1.0000 | LOZENGE | OROMUCOSAL | Status: DC | PRN

## 2022-11-11 MED ORDER — OXYCODONE HCL 5 MG PO TABS: 5.0000 mg | ORAL_TABLET | ORAL | 0 refills | Status: DC | PRN

## 2022-11-11 MED ORDER — IBUPROFEN 200 MG PO TABS: 600.0000 mg | ORAL_TABLET | Freq: Four times a day (QID) | ORAL | Status: DC

## 2022-11-11 MED ORDER — ROCURONIUM BROMIDE 100 MG/10ML IV SOLN
INTRAVENOUS | Status: DC | PRN
  Administered 2022-11-11: 80 mg via INTRAVENOUS
  Administered 2022-11-11 (×2): 20 mg via INTRAVENOUS

## 2022-11-11 MED ORDER — MIDAZOLAM HCL 5 MG/5ML IJ SOLN
INTRAMUSCULAR | Status: DC | PRN
  Administered 2022-11-11: 2 mg via INTRAVENOUS

## 2022-11-11 MED ORDER — DEXTROSE-NACL 5-0.45 % IV SOLN: INTRAVENOUS | Status: DC

## 2022-11-11 MED ORDER — SODIUM CHLORIDE 0.9 % IR SOLN
Status: DC | PRN
  Administered 2022-11-11 (×2): 1000 mL

## 2022-11-11 MED ORDER — LIDOCAINE HCL (CARDIAC) PF 100 MG/5ML IV SOSY
PREFILLED_SYRINGE | INTRAVENOUS | Status: DC | PRN
  Administered 2022-11-11: 100 mg via INTRAVENOUS

## 2022-11-11 MED ORDER — PHENYLEPHRINE 80 MCG/ML (10ML) SYRINGE FOR IV PUSH (FOR BLOOD PRESSURE SUPPORT)
PREFILLED_SYRINGE | INTRAVENOUS | Status: AC
  Filled 2022-11-11: qty 10

## 2022-11-11 MED ORDER — ACETAMINOPHEN 500 MG PO TABS
1000.0000 mg | ORAL_TABLET | ORAL | Status: AC
  Administered 2022-11-11: 1000 mg via ORAL

## 2022-11-11 SURGICAL SUPPLY — 59 items
APPLICATOR ARISTA FLEXITIP XL (MISCELLANEOUS) IMPLANT
BARRIER ADHS 3X4 INTERCEED (GAUZE/BANDAGES/DRESSINGS) IMPLANT
BLADE SURG SZ10 CARB STEEL (BLADE) IMPLANT
CABLE HIGH FREQUENCY MONO STRZ (ELECTRODE) IMPLANT
COVER BACK TABLE 60X90IN (DRAPES) IMPLANT
COVER MAYO STAND STRL (DRAPES) ×2 IMPLANT
DERMABOND ADVANCED .7 DNX12 (GAUZE/BANDAGES/DRESSINGS) ×2 IMPLANT
DEVICE SUTURE ENDOST 10MM (ENDOMECHANICALS) IMPLANT
DRSG OPSITE POSTOP 4X10 (GAUZE/BANDAGES/DRESSINGS) IMPLANT
DRSG TELFA 3X8 NADH STRL (GAUZE/BANDAGES/DRESSINGS) IMPLANT
DURAPREP 26ML APPLICATOR (WOUND CARE) ×2 IMPLANT
GAUZE 4X4 16PLY ~~LOC~~+RFID DBL (SPONGE) ×2 IMPLANT
GLOVE BIO SURGEON STRL SZ 6.5 (GLOVE) IMPLANT
GLOVE BIOGEL PI IND STRL 6.5 (GLOVE) IMPLANT
GLOVE BIOGEL PI IND STRL 7.0 (GLOVE) IMPLANT
GLOVE BIOGEL PI IND STRL 8 (GLOVE) ×2 IMPLANT
GLOVE ORTHO TXT STRL SZ7.5 (GLOVE) ×4 IMPLANT
GLOVE SURG SS PI 7.0 STRL IVOR (GLOVE) IMPLANT
GOWN STRL REUS W/ TWL LRG LVL3 (GOWN DISPOSABLE) IMPLANT
GOWN STRL REUS W/TWL LRG LVL3 (GOWN DISPOSABLE) ×2 IMPLANT
GOWN STRL REUS W/TWL XL LVL3 (GOWN DISPOSABLE) ×4 IMPLANT
HEMOSTAT ARISTA ABSORB 3G PWDR (HEMOSTASIS) IMPLANT
IV NS 1000ML (IV SOLUTION) ×4
IV NS 1000ML BAXH (IV SOLUTION) IMPLANT
KIT PINK PAD W/HEAD ARE REST (MISCELLANEOUS) ×2
KIT PINK PAD W/HEAD ARM REST (MISCELLANEOUS) ×2 IMPLANT
KIT TURNOVER CYSTO (KITS) ×2 IMPLANT
LEGGING LITHOTOMY PAIR STRL (DRAPES) IMPLANT
NDL INSUFFLATION 14GA 120MM (NEEDLE) ×2 IMPLANT
NDL SPNL 22GX3.5 QUINCKE BK (NEEDLE) ×2 IMPLANT
NEEDLE INSUFFLATION 14GA 120MM (NEEDLE) ×2 IMPLANT
NEEDLE SPNL 22GX3.5 QUINCKE BK (NEEDLE) ×2 IMPLANT
NS IRRIG 500ML POUR BTL (IV SOLUTION) IMPLANT
OCCLUDER COLPOPNEUMO (BALLOONS) ×2 IMPLANT
PACK LAPAROSCOPY BASIN (CUSTOM PROCEDURE TRAY) ×2 IMPLANT
SCISSORS LAP 5X35 DISP (ENDOMECHANICALS) IMPLANT
SET IRRIG Y TYPE TUR BLADDER L (SET/KITS/TRAYS/PACK) IMPLANT
SET SUCTION IRRIG HYDROSURG (IRRIGATION / IRRIGATOR) ×2 IMPLANT
SET TRI-LUMEN FLTR TB AIRSEAL (TUBING) ×2 IMPLANT
SHEARS 1100 HARMONIC 36 (ELECTROSURGICAL) IMPLANT
SHEARS HARMONIC ACE PLUS 36CM (ENDOMECHANICALS) IMPLANT
SOLUTION ELECTROLUBE (MISCELLANEOUS) IMPLANT
SUT ENDO VLOC 180-0-8IN (SUTURE) IMPLANT
SUT VIC AB 0 CT1 36 (SUTURE) IMPLANT
SUT VIC AB 2-0 CT1 (SUTURE) IMPLANT
SUT VIC AB 4-0 PS2 27 (SUTURE) ×2 IMPLANT
SUT VICRYL 0 UR6 27IN ABS (SUTURE) ×2 IMPLANT
SUT VLOC 180 0 9IN  GS21 (SUTURE) ×2
SUT VLOC 180 0 9IN GS21 (SUTURE) ×2 IMPLANT
SYR 10ML LL (SYRINGE) ×2 IMPLANT
SYR 50ML LL SCALE MARK (SYRINGE) ×2 IMPLANT
SYR CONTROL 10ML LL (SYRINGE) ×2 IMPLANT
TIP UTERINE 6.7X8CM BLUE DISP (MISCELLANEOUS) IMPLANT
TOWEL OR 17X26 10 PK STRL BLUE (TOWEL DISPOSABLE) ×2 IMPLANT
TRAY FOLEY W/BAG SLVR 14FR LF (SET/KITS/TRAYS/PACK) ×2 IMPLANT
TROCAR PORT AIRSEAL 5X120 (TROCAR) ×2 IMPLANT
TROCAR Z-THREAD BLADED 11X100M (TROCAR) ×2 IMPLANT
TROCAR Z-THREAD FIOS 5X100MM (TROCAR) ×2 IMPLANT
WARMER LAPAROSCOPE (MISCELLANEOUS) ×2 IMPLANT

## 2022-11-11 NOTE — Progress Notes (Signed)
Post-op check, s/p TLH with morcellation  Doing ok, mild pain, some nausea, just voided, has ambulated in room Afeb, VSS Abd- soft, mild tenderness, incisions intact  D/c home

## 2022-11-11 NOTE — Transfer of Care (Signed)
Immediate Anesthesia Transfer of Care Note  Patient: Paula Garcia  Procedure(s) Performed: Procedure(s) (LRB): TOTAL LAPAROSCOPIC HYSTERECTOMY WITH SALPINGECTOMY; RIGHT OVARIAN CYSTECTOMY (Bilateral) CYSTOSCOPY (N/A)  Patient Location: PACU  Anesthesia Type: General  Level of Consciousness: awake, sedated, patient cooperative and responds to stimulation  Airway & Oxygen Therapy: Patient Spontanous Breathing and Patient connected to  oxygen  Post-op Assessment: Report given to PACU RN, Post -op Vital signs reviewed and stable and Patient moving all extremities  Post vital signs: Reviewed and stable  Complications: No apparent anesthesia complications

## 2022-11-11 NOTE — Op Note (Signed)
Preoperative diagnosis: Symptomatic myomatous uterus Postoperative diagnosis: Same, pelvic adhesions, right ovarian endometrioma Procedure: Total laparoscopic hysterectomy, bilateral salpingectomy, right ovarian cystectomy, adhesiolysis, cystoscopy  Surgeon: Cheri Fowler M.D.  Assistant: Carlynn Purl, D.O. Anesthesia: Gen. Endotracheal tube  Findings: Uterus enlarged with multiple myomas, normal left tube and ovary, right tube and ovary adherent to uterus and pelvic sidewall, 2 cm right ovarian endometrioma.  Normal bladder, patent ureters Specimens: Uterus, tubes, right ovarian cyst for routine pathology  Estimated blood loss: 224 cc  Complications: None  Procedure in detail:  The patient was taken to the operating room and placed in the dorsosupine position. General anesthesia was induced. Arms were tucked to her sides and legs were placed in mobile stirrups. Abdomen perineum and vagina were then prepped and draped in usual sterile fashion. A 8cm RUMI with the small sized metal cup was then applied to the uterus and cervix for uterine manipulation and a Foley catheter was inserted. Infraumbilical skin was infiltrated with quarter percent Marcaine and a 1 cm vertical incision was made. A veress needle was inserted into the peritoneal cavity and placement confirmed by the water drop test an opening pressure of 3 mm of mercury. CO2 was insufflated to a pressure of 13 mm mercury and the veress needle was removed. A 5 mm trocar was then introduced with direct visualization with the laparoscope. A 5 mm airseal port was then also placed on the right side and a 10/11 trocar placed on the left side under direct visualization. Inspection revealed the above-mentioned findings. The distal right fallopian tube was grasped with a grasper from the left side. The Harmonic scalpel Ace was used to take down adhesions, then the right mesosalpinx.  The round ligament, utero-ovarian pedicle and broad ligament were then  also taken down, as well as adhesions of the ovary to the uterus. The anterior peritoneum was incised across the anterior portion of the uterus to help release the bladder. Uterine artery artery was skeletonized and taken down with the harmonic scalpel Ace with adequate division and adequate hemostasis eventually. A similar procedure was then performed on the patient's left side taking down the mesosalpinx and freeing the tube, then the round ligament, utero-ovarian pedicle, and broad ligament. Anterior peritoneum was incised across the anterior portion the uterus to meet the incision coming from the patient's right side. Uterine artery was skeletonized and taken down with the Harmonic Scalpel with adequate division and adequate hemostasis. I then began to detach the cervix from the vagina. The RUMI cup was pushed superiorly, the Harmonic scalpel was placed on the cup edge and a circumferential incision was made starting anteriorly into the vagina. This released the uterosacral ligaments as well as all attachments to the vagina. At this point all pedicles appeared to be hemostatic and there was no other pathology noted.   The uterus was unable to be pulled into the vagina due to it's size. Attention was turned vaginally.  With some difficulty due to deep vagina, the uterus was morcellated and removed vaginally.  I was then able to see the vaginal cuff well enough to close it vertically with running, locking 2-0 Vicryl.  The scrub tech and myself changed gowns and gloves and attention was turned back to the abdomen. The pelvis was irrigated and found to be hemostatic.  The right ovary was noted to be adherent close to the vaginal cuff.  More adhesions of the ovary to the pelvic sidewall were taken down with Harmonic scalpel, careful to avoid the  ureter and bowel.  Once the ovary was frees, there appeared to be a 2 cm endometrioma.  Harmonic scalpel was used to incise into the cyst and it drained chocolate fluid.   The entire cyst was then removed with Harmonic scalpel and the cyst was removed.  The ovary was hemostatic and much more mobile. A 0 Vicryl suture was placed as deep as possible on the left side where the 10/11 port was placed. All other trocars were removed after gas was removed.  Skin incisions were closed with interrupted subcuticular sutures of 4-0 Vicryl followed by Dermabond.   Cystoscopy was performed after the foley catheter was removed.  The bladder appeared normal, urine flowed from each ureteral orifice.  The bladder was drained.  The patient tolerated the procedure well. She was taken to the recovery in stable condition. Counts were correct x2, she received Ancef 2 gm IV at the beginning of the procedure and had PAS hose on throughout the procedure.

## 2022-11-11 NOTE — Discharge Instructions (Addendum)
Routine instructions for laparoscopic hysterectomy Post Anesthesia Home Care Instructions  Activity: Get plenty of rest for the remainder of the day. A responsible individual must stay with you for 24 hours following the procedure.  For the next 24 hours, DO NOT: -Drive a car -Paediatric nurse -Drink alcoholic beverages -Take any medication unless instructed by your physician -Make any legal decisions or sign important papers.  Meals: Start with liquid foods such as gelatin or soup. Progress to regular foods as tolerated. Avoid greasy, spicy, heavy foods. If nausea and/or vomiting occur, drink only clear liquids until the nausea and/or vomiting subsides. Call your physician if vomiting continues.  Special Instructions/Symptoms: Your throat may feel dry or sore from the anesthesia or the breathing tube placed in your throat during surgery. If this causes discomfort, gargle with warm salt water. The discomfort should disappear within 24 hours.  If you had a scopolamine patch placed behind your ear for the management of post- operative nausea and/or vomiting:  1. The medication in the patch is effective for 72 hours, after which it should be removed.  Wrap patch in a tissue and discard in the trash. Wash hands thoroughly with soap and water. 2. You may remove the patch earlier than 72 hours if you experience unpleasant side effects which may include dry mouth, dizziness or visual disturbances. 3. Avoid touching the patch. Wash your hands with soap and water after contact with the patch.

## 2022-11-11 NOTE — Interval H&P Note (Signed)
History and Physical Interval Note:  11/11/2022 7:14 AM  Paula Garcia  has presented today for surgery, with the diagnosis of symptomatic myomatous uterus.  The various methods of treatment have been discussed with the patient and family. After consideration of risks, benefits and other options for treatment, the patient has consented to  Procedure(s): TOTAL LAPAROSCOPIC HYSTERECTOMY WITH SALPINGECTOMY (Bilateral) CYSTOSCOPY (N/A) as a surgical intervention.  The patient's history has been reviewed, patient examined, no change in status, stable for surgery.  I have reviewed the patient's chart and labs.  Questions were answered to the patient's satisfaction.     Blane Ohara Osric Klopf

## 2022-11-11 NOTE — Anesthesia Procedure Notes (Signed)
Procedure Name: Intubation Date/Time: 11/11/2022 7:41 AM  Performed by: Justice Rocher, CRNAPre-anesthesia Checklist: Patient identified, Emergency Drugs available, Suction available, Patient being monitored and Timeout performed Patient Re-evaluated:Patient Re-evaluated prior to induction Oxygen Delivery Method: Circle system utilized Preoxygenation: Pre-oxygenation with 100% oxygen Induction Type: IV induction Ventilation: Mask ventilation without difficulty Laryngoscope Size: Mac and 3 Grade View: Grade II Tube type: Oral Tube size: 7.0 mm Number of attempts: 1 Airway Equipment and Method: Stylet and Oral airway Placement Confirmation: ETT inserted through vocal cords under direct vision, positive ETCO2, breath sounds checked- equal and bilateral and CO2 detector Secured at: 23 cm Tube secured with: Tape Dental Injury: Teeth and Oropharynx as per pre-operative assessment

## 2022-11-11 NOTE — Anesthesia Postprocedure Evaluation (Signed)
Anesthesia Post Note  Patient: Paula Garcia  Procedure(s) Performed: TOTAL LAPAROSCOPIC HYSTERECTOMY WITH SALPINGECTOMY; RIGHT OVARIAN CYSTECTOMY (Bilateral: Abdomen) CYSTOSCOPY (Urethra)     Patient location during evaluation: PACU Anesthesia Type: General Level of consciousness: sedated Pain management: pain level controlled Vital Signs Assessment: post-procedure vital signs reviewed and stable Respiratory status: spontaneous breathing and respiratory function stable Cardiovascular status: stable Postop Assessment: no apparent nausea or vomiting Anesthetic complications: yes  Encounter Notable Events  Notable Event Outcome Phase Comment  Nausea  In Recovery     Last Vitals:  Vitals:   11/11/22 1143 11/11/22 1148  BP:    Pulse: 79 78  Resp: (!) 24 14  Temp:    SpO2: 97% 97%    Last Pain:  Vitals:   11/11/22 1056  TempSrc:   PainSc: 0-No pain                 Kaymarie Wynn DANIEL

## 2022-11-12 ENCOUNTER — Encounter (HOSPITAL_BASED_OUTPATIENT_CLINIC_OR_DEPARTMENT_OTHER): Payer: Self-pay | Admitting: Obstetrics and Gynecology

## 2022-11-12 LAB — SURGICAL PATHOLOGY

## 2022-12-17 ENCOUNTER — Encounter: Payer: Self-pay | Admitting: Internal Medicine

## 2023-05-12 ENCOUNTER — Encounter: Payer: Self-pay | Admitting: Internal Medicine

## 2023-06-15 ENCOUNTER — Encounter: Payer: Self-pay | Admitting: Internal Medicine

## 2023-06-27 ENCOUNTER — Encounter: Payer: Self-pay | Admitting: Nurse Practitioner

## 2023-06-27 ENCOUNTER — Ambulatory Visit (INDEPENDENT_AMBULATORY_CARE_PROVIDER_SITE_OTHER): Payer: 59 | Admitting: Nurse Practitioner

## 2023-06-27 VITALS — BP 110/70 | HR 77 | Temp 97.7°F | Ht 65.5 in | Wt 203.0 lb

## 2023-06-27 DIAGNOSIS — Z79899 Other long term (current) drug therapy: Secondary | ICD-10-CM

## 2023-06-27 DIAGNOSIS — E559 Vitamin D deficiency, unspecified: Secondary | ICD-10-CM

## 2023-06-27 DIAGNOSIS — Z86018 Personal history of other benign neoplasm: Secondary | ICD-10-CM

## 2023-06-27 DIAGNOSIS — Z0001 Encounter for general adult medical examination with abnormal findings: Secondary | ICD-10-CM

## 2023-06-27 DIAGNOSIS — M5136 Other intervertebral disc degeneration, lumbar region: Secondary | ICD-10-CM

## 2023-06-27 DIAGNOSIS — Z1389 Encounter for screening for other disorder: Secondary | ICD-10-CM

## 2023-06-27 DIAGNOSIS — R195 Other fecal abnormalities: Secondary | ICD-10-CM

## 2023-06-27 DIAGNOSIS — Z131 Encounter for screening for diabetes mellitus: Secondary | ICD-10-CM

## 2023-06-27 DIAGNOSIS — E781 Pure hyperglyceridemia: Secondary | ICD-10-CM

## 2023-06-27 DIAGNOSIS — Z9109 Other allergy status, other than to drugs and biological substances: Secondary | ICD-10-CM

## 2023-06-27 DIAGNOSIS — E669 Obesity, unspecified: Secondary | ICD-10-CM

## 2023-06-27 DIAGNOSIS — Z0000 Encounter for general adult medical examination without abnormal findings: Secondary | ICD-10-CM

## 2023-06-27 DIAGNOSIS — R454 Irritability and anger: Secondary | ICD-10-CM

## 2023-06-27 DIAGNOSIS — Z1329 Encounter for screening for other suspected endocrine disorder: Secondary | ICD-10-CM

## 2023-06-27 DIAGNOSIS — E538 Deficiency of other specified B group vitamins: Secondary | ICD-10-CM

## 2023-06-27 LAB — CBC WITH DIFFERENTIAL/PLATELET
Absolute Monocytes: 944 cells/uL (ref 200–950)
Basophils Absolute: 73 cells/uL (ref 0–200)
Basophils Relative: 0.6 %
Eosinophils Absolute: 73 cells/uL (ref 15–500)
Eosinophils Relative: 0.6 %
HCT: 39.6 % (ref 35.0–45.0)
Hemoglobin: 13.7 g/dL (ref 11.7–15.5)
Lymphs Abs: 2710 cells/uL (ref 850–3900)
MCH: 29.8 pg (ref 27.0–33.0)
MCHC: 34.6 g/dL (ref 32.0–36.0)
MCV: 86.3 fL (ref 80.0–100.0)
MPV: 9.7 fL (ref 7.5–12.5)
Monocytes Relative: 7.8 %
Neutro Abs: 8301 cells/uL — ABNORMAL HIGH (ref 1500–7800)
Neutrophils Relative %: 68.6 %
Platelets: 347 10*3/uL (ref 140–400)
RBC: 4.59 10*6/uL (ref 3.80–5.10)
RDW: 12.8 % (ref 11.0–15.0)
Total Lymphocyte: 22.4 %
WBC: 12.1 10*3/uL — ABNORMAL HIGH (ref 3.8–10.8)

## 2023-06-27 MED ORDER — BUPROPION HCL ER (SR) 100 MG PO TB12
100.0000 mg | ORAL_TABLET | Freq: Every day | ORAL | 2 refills | Status: AC
Start: 2023-06-27 — End: ?

## 2023-06-27 NOTE — Progress Notes (Signed)
Complete Physical  Assessment and Plan:  Paula Garcia was seen today for annual exam.  Diagnoses and all orders for this visit:  Encounter for routine adult health examination with abnormal findings Due annually  Health maintenance reviewed Healthily lifestyle goals set  DDD (degenerative disc disease), lumbar No recent flares Symptoms well controlled, Uses NSAIDS PRN Nerve conduction study which was normal, persistent left leg numbness.  Monitor, if symptoms worsen notify office and will refer to PT  Obesity (BMI 30.0-34.9) Discussed appropriate BMI Diet modification. Physical activity. Encouraged/praised to build confidence.  Hypertriglyceridemia Discussed lifestyle modifications. Recommended diet heavy in fruits and veggies, omega 3's. Decrease consumption of animal meats, cheeses, and dairy products. Remain active and exercise as tolerated. Continue to monitor. Check lipids/TSH  Screening for diabetes mellitus Education: Reviewed 'ABCs' of diabetes management  A1C (<7) Blood pressure (<130/80) Cholesterol (LDL <70) Continue Eye Exam yearly  Continue Dental Exam Q6 mo Discussed dietary recommendations Discussed Physical Activity recommendations Check A1c/Insulin  Screening for thyroid disorder Check TSH  Vitamin D deficiency Continue supplement Check Vit D  Medication management All medications discussed and reviewed in full. All questions and concerns regarding medications addressed.    B12 deficiency Continue supplement Check B12  Screening for blood or protein in urine Urinalysis, Routine w reflex microscopic   Environmental allergies Avoid triggers. OTC antihistamine  HX of Dysplastic Nevi No concerns  Continue yearly derm checks  Positive cologard  Colonoscopy negative - recall 10 years Continue to monitor  Irritability/overstimulated Start Wellbutrin as directed  Orders Placed This Encounter  Procedures   CBC with Differential/Platelet    COMPLETE METABOLIC PANEL WITH GFR   Lipid panel   TSH   Hemoglobin A1c   Insulin, random   VITAMIN D 25 Hydroxy (Vit-D Deficiency, Fractures)   Urinalysis, Routine w reflex microscopic   Microalbumin / creatinine urine ratio   Vitamin B12   Meds ordered this encounter  Medications   buPROPion ER (WELLBUTRIN SR) 100 MG 12 hr tablet    Sig: Take 1 tablet (100 mg total) by mouth daily.    Dispense:  30 tablet    Refill:  2    Order Specific Question:   Supervising Provider    Answer:   Lucky Cowboy [phone]    Notify office for further evaluation and treatment, questions or concerns if any reported s/s fail to improve.   The patient was advised to call back or seek an in-person evaluation if any symptoms worsen or if the condition fails to improve as anticipated.   Further disposition pending results of labs. Discussed med's effects and SE's.    I discussed the assessment and treatment plan with the patient. The patient was provided an opportunity to ask questions and all were answered. The patient agreed with the plan and demonstrated an understanding of the instructions.  Discussed med's effects and SE's. Screening labs and tests as requested with regular follow-up as recommended.  I provided 35 minutes of face-to-face time during this encounter including counseling, chart review, and critical decision making was preformed.  Today's Plan of Care is based on a patient-centered health care approach known as shared decision making - the decisions, tests and treatments allow for patient preferences and values to be balanced with clinical evidence.      Future Appointments  Date Time Provider Department Center  06/27/2024  3:00 PM [name], NP [facility] None    HPI  47 y.o. Caucasian female  presents for a complete physical and follow  up. She has Environmental allergies; DDD (degenerative disc disease), lumbar; Obesity (BMI 30.0-34.9); History of dysplastic nevus;  Vitamin D deficiency; Melasma; B12 deficiency; and S/P laparoscopic hysterectomy on their problem list.   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  She is concerned for irritability and slow to anger feeling overstimulated.  Reports sleeping well.  Feels this may have been of concern over the last several years but has failed to address.  Denies obsessive thoughts or crying.  Does note increase overall fatigue.  She has tried Buspar in the past without effectiveness.  Was on Gabapentin as well, no longer taking.   Sees OBGYN Dr. Jackelyn Knife annually.  Had partial hysterectomy 2024.  Still has ovaries.  Does not know if mood has changed since hysterectomy.    Had mammogram 07/2022 and has follow up scheduled for this year 07/2023.  This past year reported bil leg numbness, hx of DDD, saw neuro Dr. Allena Katz who found S1 radiculopathy by nerve conduction study, did PT and now manages well with stretches at home PRN.    BMI is Body mass index is 33.27 kg/m., she has been working on diet and exercise. She exercises 4-5 days a week tries to eat a well balanced diet.  Wt Readings from Last 3 Encounters:  06/27/23 203 lb (92.1 kg)  11/11/22 203 lb 3.2 oz (92.2 kg)  11/08/22 201 lb 9.6 oz (91.4 kg)   Today their BP is BP: 110/70  She does workout. She denies chest pain, shortness of breath, dizziness.    The cholesterol last visit was:   Lab Results  Component Value Date   CHOL 191 06/23/2022   HDL 60 06/23/2022   LDLCALC 106 (H) 06/23/2022   TRIG 139 06/23/2022   CHOLHDL 3.2 06/23/2022    Last A1C in the office was:  Lab Results  Component Value Date   HGBA1C 4.9 06/23/2022   Last GFR: Lab Results  Component Value Date   GFRNONAA 108 05/29/2020   Patient is currently on Vitamin D supplement, unsure of dose   Lab Results  Component Value Date   VD25OH 80 06/23/2022     She is on b12 supplement.   Lab Results  Component Value Date   VITAMINB12 456 06/18/2021      Current Medications:  Current Outpatient Medications on File Prior to Visit  Medication Sig Dispense Refill   b complex vitamins capsule Take 1 capsule by mouth daily.     cetirizine (ZYRTEC) 10 MG tablet Take 10 mg by mouth daily as needed for allergies.     Cholecalciferol (VITAMIN D3 PO) Take 5,000 Units by mouth daily at 6 (six) AM.     ibuprofen (ADVIL) 600 MG tablet Take 1 tablet (600 mg total) by mouth every 6 (six) hours. 30 tablet 0   LYSINE PO Take by mouth.     Multiple Vitamin (MULTIVITAMIN) tablet Take 1 tablet by mouth daily.     Omega-3 Fatty Acids (FISH OIL PO) Take 1 tablet by mouth daily at 6 (six) AM.     Probiotic Product (PROBIOTIC BLEND PO) Take by mouth.     TURMERIC CURCUMIN PO Take 1 tablet by mouth daily at 6 (six) AM.     valACYclovir (VALTREX) 1000 MG tablet Take 4,000 mg by mouth daily as needed.     Zinc 50 MG TABS Take 1 tablet by mouth daily at 6 (six) AM.     busPIRone (BUSPAR)  5 MG tablet Take 1 tablet (5 mg total) by mouth 3 (three) times daily. (Patient not taking: Reported on 11/03/2022) 90 tablet 0   gabapentin (NEURONTIN) 300 MG capsule Take 1 capsule (300 mg total) by mouth 3 (three) times daily. (Patient not taking: Reported on 06/27/2023) 7 capsule 0   oxyCODONE (OXY IR/ROXICODONE) 5 MG immediate release tablet Take 1 tablet (5 mg total) by mouth every 4 (four) hours as needed for severe pain. 10 tablet 0   No current facility-administered medications on file prior to visit.   Allergies:  Not on File Medical History:  She has Environmental allergies; DDD (degenerative disc disease), lumbar; Obesity (BMI 30.0-34.9); History of dysplastic nevus; Vitamin D deficiency; Melasma; B12 deficiency; and S/P laparoscopic hysterectomy on their problem list. Health Maintenance:   Immunization History  Administered Date(s) Administered   Influenza Inj Mdck Quad With Preservative 09/06/2018, 08/21/2019   Influenza,inj,Quad PF,6+ Mos 10/07/2021    PFIZER(Purple Top)SARS-COV-2 Vaccination 02/15/2020, 03/11/2020   Tdap 10/07/2011, 06/23/2022   Tetanus: 06/2022 Pneumovax: N/A Prevnar 13: N/A Flu vaccine: 2023 Zostavax: N/A  Covid 19: Pfizer 02/15/20, 03/11/20 , Booster 01/05/21  LMP: 06/02/22 Pap: 2020, has q3y by Dr. Jackelyn Knife, 10/2023 - partial hysterectomy (has ovaries) 3-DMGM: 07/2022 scheduled for 07/2023  DEXA: N/A Colonoscopy: Positive cologard 07/2022 - Colonoscopy performed and negative.  Recall 10 years.  Last Dental Exam: 12/2022, q6 months, has scheduled Last Eye Exam: 2024, wears readers, no concerns  Last Derm Exam: history of dysplastic nevus of back -   Patient Care Team: Lucky Cowboy, MD as PCP - General (Internal Medicine)  Surgical History:  She has a past surgical history that includes Cervical fusion (2012); Colposcopy; Cesarean section (2010); Wisdom tooth extraction (1995); Laparoscopic abdominal exploration (6213,0865); Colonoscopy (09/14/2022); Total laparoscopic hysterectomy with salpingectomy (Bilateral, 11/11/2022); and Cystoscopy (N/A, 11/11/2022). Family History:  Herfamily history includes Brain cancer in her paternal grandmother; COPD in her father; Cancer in her mother; Congenital Murmur in her son; Heart disease in her father; Stroke in her maternal grandfather; Throat cancer in her maternal aunt. Social History:  She reports that she has never smoked. She has never used smokeless tobacco. She reports current alcohol use of about 5.0 standard drinks of alcohol per week. She reports that she does not use drugs.  Review of Systems: Review of Systems  Constitutional:  Negative for chills, fever, malaise/fatigue and weight loss.  HENT:  Negative for congestion, hearing loss, sinus pain, sore throat and tinnitus.   Eyes:  Negative for blurred vision and double vision.  Respiratory:  Negative for cough, hemoptysis, sputum production, shortness of breath and wheezing.   Cardiovascular:  Negative for  chest pain, palpitations, orthopnea, claudication and leg swelling.  Gastrointestinal:  Negative for abdominal pain, blood in stool, constipation, diarrhea, heartburn, melena, nausea and vomiting.  Genitourinary: Negative.  Negative for dysuria and urgency.  Musculoskeletal:  Negative for back pain, falls, joint pain, myalgias and neck pain.  Skin:  Negative for rash.  Neurological:  Negative for dizziness, tingling, tremors, sensory change, weakness and headaches.  Endo/Heme/Allergies:  Negative for polydipsia. Does not bruise/bleed easily.  Psychiatric/Behavioral: Negative.  Negative for depression and suicidal ideas. The patient is not nervous/anxious and does not have insomnia.   All other systems reviewed and are negative.   Physical Exam: Estimated body mass index is 33.27 kg/m as calculated from the following:   Height as of this encounter: 5' 5.5" (1.664 m).   Weight as of this encounter: 203 lb (92.1  kg). BP 110/70   Pulse 77   Temp 97.7 F (36.5 C)   Ht 5' 5.5" (1.664 m)   Wt 203 lb (92.1 kg)   SpO2 99%   BMI 33.27 kg/m  General Appearance: Well nourished, in no apparent distress.  Eyes: PERRLA, EOMs, conjunctiva no swelling or erythema Sinuses: No Frontal/maxillary tenderness  ENT/Mouth: Ext aud canals clear, normal light reflex with TMs without erythema, bulging. Good dentition. No erythema, swelling, or exudate on post pharynx. Tonsils not swollen or erythematous. Hearing normal.  Neck: Supple, thyroid normal. No bruits  Respiratory: Respiratory effort normal, BS equal bilaterally without rales, rhonchi, wheezing or stridor.  Cardio: RRR without murmurs, rubs or gallops. Brisk peripheral pulses without edema.  Chest: symmetric, with normal excursions and percussion.  Breasts: Defer to GYN  Abdomen: Soft, nontender, no guarding, rebound, hernias, masses, or organomegaly.  Lymphatics: Non tender without lymphadenopathy.  Genitourinary: Defer to GYN Musculoskeletal:  Full ROM all peripheral extremities,5/5 strength, and normal gait.  Skin: Warm, dry without rashes, ecchymosis. She has numerous small nevi, flat, <6 mm; two to back with irregular borders, unchanged from last year. Flat brown discoloration approx 2 cm area to left cheek.  Neuro: Cranial nerves intact, reflexes equal bilaterally. Normal muscle tone, no cerebellar symptoms. Sensation intact.  Psych: Awake and oriented X 3, normal affect, Insight and Judgment appropriate.   EKG:  10/2022 NSR   Shany Marinez 3:48 PM [facility]

## 2023-06-27 NOTE — Patient Instructions (Signed)
Bupropion Tablets (Depression/Mood Disorders) What is this medication? BUPROPION (byoo PROE pee on) treats depression. It increases norepinephrine and dopamine in the brain, hormones that help regulate mood. It belongs to a group of medications called NDRIs. This medicine may be used for other purposes; ask your health care provider or pharmacist if you have questions. COMMON BRAND NAME(S): Wellbutrin What should I tell my care team before I take this medication? They need to know if you have any of these conditions: An eating disorder, such as anorexia or bulimia Bipolar disorder or psychosis Diabetes or high blood sugar, treated with medication Glaucoma Head injury or brain tumor Heart disease, previous heart attack, or irregular heart beat High blood pressure Kidney disease Liver disease Seizures Suicidal thoughts, plans, or attempt by you or a family member Tourette syndrome Weight loss An unusual or allergic reaction to bupropion, other medications, foods, dyes, or preservatives Pregnant or trying to become pregnant Breastfeeding How should I use this medication? Take this medication by mouth with a glass of water. Follow the directions on the prescription label. You can take it with or without food. If it upsets your stomach, take it with food. Take your medication at regular intervals. Do not take your medication more often than directed. Do not stop taking this medication suddenly except upon the advice of your care team. Stopping this medication too quickly may cause serious side effects or your condition may worsen. A special MedGuide will be given to you by the pharmacist with each prescription and refill. Be sure to read this information carefully each time. Talk to your care team regarding the use of this medication in children. Special care may be needed. Overdosage: If you think you have taken too much of this medicine contact a poison control center or emergency room at  once. NOTE: This medicine is only for you. Do not share this medicine with others. What if I miss a dose? If you miss a dose, take it as soon as you can. If it is less than four hours to your next dose, take only that dose and skip the missed dose. Do not take double or extra doses. What may interact with this medication? Do not take this medication with any of the following: Linezolid MAOIs, such as Azilect, Carbex, Eldepryl, Marplan, Nardil, and Parnate Methylene blue (injected into a vein) Other medications that contain bupropion, such as Zyban This medication may also interact with the following: Alcohol Certain medications for anxiety or sleep Certain medications for blood pressure, such as metoprolol, propranolol Certain medications for HIV or AIDS, such as efavirenz, lopinavir, nelfinavir, ritonavir Certain medications for irregular heartbeat, such as propafenone, flecainide Certain medications for mental health conditions Certain medications for Parkinson disease, such as amantadine, levodopa Certain medications for seizures, such as carbamazepine, phenytoin, phenobarbital Cimetidine Clopidogrel Cyclophosphamide Digoxin Furazolidone Isoniazid Nicotine Orphenadrine Procarbazine Steroid medications, such as prednisone or cortisone Stimulant medications for attention disorders, weight loss, or to stay awake Tamoxifen Theophylline Thiotepa Ticlopidine Tramadol Warfarin This list may not describe all possible interactions. Give your health care provider a list of all the medicines, herbs, non-prescription drugs, or dietary supplements you use. Also tell them if you smoke, drink alcohol, or use illegal drugs. Some items may interact with your medicine. What should I watch for while using this medication? Tell your care team if your symptoms do not get better or if they get worse. Visit your care team for regular checks on your progress. Because it may take several  weeks to see  the full effects of this medication, it is important to continue your treatment as prescribed. This medication may cause thoughts of suicide or depression. This includes sudden changes in mood, behaviors, or thoughts. These changes can happen at any time but are more common in the beginning of treatment or after a change in dose. Call your care team right away if you experience these thoughts or worsening depression. This medication may cause mood and behavior changes, such as anxiety, nervousness, irritability, hostility, restlessness, excitability, hyperactivity, or trouble sleeping. These changes can happen at any time but are more common in the beginning of treatment or after a change in dose. Call your care team right away if you notice any of these symptoms. This medication may cause serious skin reactions. They can happen weeks to months after starting the medication. Contact your care team right away if you notice fevers or flu-like symptoms with a rash. The rash may be red or purple and then turn into blisters or peeling of the skin. You may also notice a red rash with swelling of the face, lips, or lymph nodes in your neck or under your arms. Avoid drinks that contain alcohol while taking this medication. Drinking large amounts of alcohol, using sleeping or anxiety medications, or quickly stopping the use of these agents while taking this medication may increase your risk for a seizure. This medication may affect your coordination, reaction time, or judgment. Do not drive or operate machinery until you know how this medication affects you. Do not take this medication close to bedtime. It may prevent you from sleeping. Your mouth may get dry. Chewing sugarless gum or sucking hard candy and drinking plenty of water may help. Contact your care team if the problem does not go away or is severe. What side effects may I notice from receiving this medication? Side effects that you should report to your  care team as soon as possible: Allergic reactions--skin rash, itching, hives, swelling of the face, lips, tongue, or throat Increase in blood pressure Mood and behavior changes--anxiety, nervousness, confusion, hallucinations, irritability, hostility, thoughts of suicide or self-harm, worsening mood, feelings of depression Redness, blistering, peeling, or loosening of the skin, including inside the mouth Seizures Sudden eye pain or change in vision such as blurry vision, seeing halos around lights, vision loss Side effects that usually do not require medical attention (report to your care team if they continue or are bothersome): Constipation Dizziness Dry mouth Loss of appetite Nausea Tremors or shaking Trouble sleeping This list may not describe all possible side effects. Call your doctor for medical advice about side effects. You may report side effects to FDA at 1-800-FDA-1088. Where should I keep my medication? Keep out of the reach of children and pets. Store at room temperature between 20 and 25 degrees C (68 and 77 degrees F), away from direct sunlight and moisture. Keep tightly closed. Throw away any unused medication after the expiration date. NOTE: This sheet is a summary. It may not cover all possible information. If you have questions about this medicine, talk to your doctor, pharmacist, or health care provider.  2024 [employer]vier/Gold Standard (2022-08-01 00:00:00)

## 2023-06-29 ENCOUNTER — Encounter: Payer: Self-pay | Admitting: Nurse Practitioner

## 2023-07-18 ENCOUNTER — Encounter: Payer: Self-pay | Admitting: Nurse Practitioner

## 2023-07-18 DIAGNOSIS — F419 Anxiety disorder, unspecified: Secondary | ICD-10-CM

## 2023-07-27 MED ORDER — BUSPIRONE HCL 5 MG PO TABS
5.0000 mg | ORAL_TABLET | Freq: Three times a day (TID) | ORAL | 0 refills | Status: AC
Start: 2023-07-27 — End: ?

## 2023-08-07 ENCOUNTER — Encounter: Payer: Self-pay | Admitting: Nurse Practitioner

## 2023-08-10 ENCOUNTER — Encounter: Payer: Self-pay | Admitting: Nurse Practitioner

## 2023-08-15 LAB — HM MAMMOGRAPHY

## 2023-08-17 ENCOUNTER — Encounter: Payer: Self-pay | Admitting: Nurse Practitioner

## 2023-08-24 ENCOUNTER — Other Ambulatory Visit: Payer: Self-pay | Admitting: Nurse Practitioner

## 2023-08-24 DIAGNOSIS — F419 Anxiety disorder, unspecified: Secondary | ICD-10-CM

## 2023-09-05 ENCOUNTER — Encounter: Payer: Self-pay | Admitting: Nurse Practitioner

## 2023-09-05 ENCOUNTER — Ambulatory Visit (INDEPENDENT_AMBULATORY_CARE_PROVIDER_SITE_OTHER): Payer: 59 | Admitting: Nurse Practitioner

## 2023-09-05 VITALS — BP 128/78 | HR 100 | Temp 97.9°F | Resp 16 | Ht 65.5 in | Wt 206.0 lb

## 2023-09-05 DIAGNOSIS — E66811 Obesity, class 1: Secondary | ICD-10-CM

## 2023-09-05 DIAGNOSIS — E781 Pure hyperglyceridemia: Secondary | ICD-10-CM | POA: Diagnosis not present

## 2023-09-05 DIAGNOSIS — Z79899 Other long term (current) drug therapy: Secondary | ICD-10-CM

## 2023-09-05 NOTE — Patient Instructions (Signed)

## 2023-09-05 NOTE — Progress Notes (Signed)
Assessment and Plan:  Paula Garcia was seen today for an episodic visit.  Diagnoses and all order for this visit:  Obesity (BMI 30.0-34.9) Discussed starting Phentermine versus Wegovy considering lifesltyle modifiations have failed. Patient to continue research and will reach out if continuing to pursue medicaitons options.  Discussed appropriate BMI Diet modification. Physical activity. Encouraged/praised to build confidence.   Hypertriglyceridemia Discussed lifestyle modifications. Recommended diet heavy in fruits and veggies, omega 3's. Decrease consumption of animal meats, cheeses, and dairy products. Remain active and exercise as tolerated. Continue to monitor. Check lipids/TSH  Medication management All medications discussed and reviewed in full. All questions and concerns regarding medications addressed.    Notify office for further evaluation and treatment, questions or concerns if s/s fail to improve. The risks and benefits of my recommendations, as well as other treatment options were discussed with the patient today. Questions were answered.  Further disposition pending results of labs. Discussed med's effects and SE's.    Over 20 minutes of exam, counseling, chart review, and critical decision making was performed.   Future Appointments  Date Time Provider Department Center  09/29/2023  9:30 AM Paula Glimpse, NP [facility] None  06/27/2024  3:00 PM Asmar Brozek, Archie Patten, NP [facility] None    ------------------------------------------------------------------------------------------------------------------   HPI BP 128/78   Pulse 100   Temp 97.9 F (36.6 C)   Resp 16   Ht 5' 5.5" (1.664 m)   Wt 206 lb (93.4 kg)   SpO2 97%   BMI 33.76 kg/m   Paula Garcia presents to discuss weight loss options.  She is currently obese with BMI >33.  She has implemented lifestyle modifications through diet and exercise but has been unsuccessful at losing weight.  She has  not tried Phentermine but feels this  may be an option.  She also has a hx of elevated cholesterol and wishes to lose weight to help control overall risk for heart attack and stroke.  Associated DDD which is contributed to obesity.   She is not on cholesterol medication and denies myalgias. Her cholesterol is not at goal. The cholesterol last visit was:   Lab Results  Component Value Date   CHOL 190 06/27/2023   HDL 62 06/27/2023   LDLCALC 103 (H) 06/27/2023   TRIG 157 (H) 06/27/2023   CHOLHDL 3.1 06/27/2023     Past Medical History:  Diagnosis Date   Anxiety    11/03/22 no longer taking Buspar, follows with PCP.   COVID-19 2021   cold-like symptoms with persistent HA   DDD (degenerative disc disease)    S1 radiculopathy by 09/06/2019 nerve conduction study in Epic, bilateral leg numbness   Endometriosis    Seasonal allergies    Uterine leiomyoma      Not on File  Current Outpatient Medications on File Prior to Visit  Medication Sig   b complex vitamins capsule Take 1 capsule by mouth daily.   buPROPion ER (WELLBUTRIN SR) 100 MG 12 hr tablet Take 1 tablet (100 mg total) by mouth daily.   busPIRone (BUSPAR) 5 MG tablet TAKE 1 TABLET(5 MG) BY MOUTH THREE TIMES DAILY   cetirizine (ZYRTEC) 10 MG tablet Take 10 mg by mouth daily as needed for allergies.   Cholecalciferol (VITAMIN D3 PO) Take 5,000 Units by mouth daily at 6 (six) AM.   gabapentin (NEURONTIN) 300 MG capsule Take 1 capsule (300 mg total) by mouth 3 (three) times daily. (Patient not taking: Reported on 06/27/2023)   ibuprofen (ADVIL) 600 MG  tablet Take 1 tablet (600 mg total) by mouth every 6 (six) hours.   LYSINE PO Take by mouth.   Multiple Vitamin (MULTIVITAMIN) tablet Take 1 tablet by mouth daily.   Omega-3 Fatty Acids (FISH OIL PO) Take 1 tablet by mouth daily at 6 (six) AM.   Probiotic Product (PROBIOTIC BLEND PO) Take by mouth.   TURMERIC CURCUMIN PO Take 1 tablet by mouth daily at 6 (six) AM.   valACYclovir  (VALTREX) 1000 MG tablet Take 4,000 mg by mouth daily as needed.   Zinc 50 MG TABS Take 1 tablet by mouth daily at 6 (six) AM.   No current facility-administered medications on file prior to visit.    ROS: all negative except what is noted in the HPI.   Physical Exam:  BP 128/78   Pulse 100   Temp 97.9 F (36.6 C)   Resp 16   Ht 5' 5.5" (1.664 m)   Wt 206 lb (93.4 kg)   SpO2 97%   BMI 33.76 kg/m   General Appearance: NAD.  Awake, conversant and cooperative. Eyes: PERRLA, EOMs intact.  Sclera white.  Conjunctiva without erythema. Sinuses: No frontal/maxillary tenderness.  No nasal discharge. Nares patent.  ENT/Mouth: Ext aud canals clear.  Bilateral TMs w/DOL and without erythema or bulging. Hearing intact.  Posterior pharynx without swelling or exudate.  Tonsils without swelling or erythema.  Neck: Supple.  No masses, nodules or thyromegaly. Respiratory: Effort is regular with non-labored breathing. Breath sounds are equal bilaterally without rales, rhonchi, wheezing or stridor.  Cardio: RRR with no MRGs. Brisk peripheral pulses without edema.  Abdomen: Active BS in all four quadrants.  Soft and non-tender without guarding, rebound tenderness, hernias or masses. Lymphatics: Non tender without lymphadenopathy.  Musculoskeletal: Full ROM, 5/5 strength, normal ambulation.  No clubbing or cyanosis. Skin: Appropriate color for ethnicity. Warm without rashes, lesions, ecchymosis, ulcers.  Neuro: CN II-XII grossly normal. Normal muscle tone without cerebellar symptoms and intact sensation.   Psych: AO X 3,  appropriate mood and affect, insight and judgment.     Paula Glimpse, NP 2:57 PM [facility]

## 2023-09-06 ENCOUNTER — Other Ambulatory Visit: Payer: Self-pay | Admitting: Nurse Practitioner

## 2023-09-06 MED ORDER — PHENTERMINE HCL 37.5 MG PO TABS: ORAL_TABLET | ORAL | 0 refills | Status: DC

## 2023-09-07 ENCOUNTER — Encounter: Payer: Self-pay | Admitting: Internal Medicine

## 2023-09-20 ENCOUNTER — Other Ambulatory Visit: Payer: Self-pay | Admitting: Nurse Practitioner

## 2023-09-20 DIAGNOSIS — F419 Anxiety disorder, unspecified: Secondary | ICD-10-CM

## 2023-09-29 ENCOUNTER — Encounter: Payer: Self-pay | Admitting: Nurse Practitioner

## 2023-09-29 ENCOUNTER — Ambulatory Visit: Payer: 59 | Admitting: Nurse Practitioner

## 2023-09-29 VITALS — BP 120/70 | HR 80 | Temp 97.6°F | Ht 65.5 in | Wt 201.6 lb

## 2023-09-29 DIAGNOSIS — Z23 Encounter for immunization: Secondary | ICD-10-CM

## 2023-09-29 DIAGNOSIS — Z79899 Other long term (current) drug therapy: Secondary | ICD-10-CM | POA: Diagnosis not present

## 2023-09-29 DIAGNOSIS — R454 Irritability and anger: Secondary | ICD-10-CM | POA: Diagnosis not present

## 2023-09-29 DIAGNOSIS — E66811 Obesity, class 1: Secondary | ICD-10-CM | POA: Diagnosis not present

## 2023-09-29 NOTE — Patient Instructions (Signed)
Buspirone Tablets What is this medication? BUSPIRONE (byoo SPYE rone) treats anxiety. It works by balancing the levels of dopamine and serotonin in your brain, substances that help regulate mood. This medicine may be used for other purposes; ask your health care provider or pharmacist if you have questions. COMMON BRAND NAME(S): BuSpar, Buspar Dividose What should I tell my care team before I take this medication? They need to know if you have any of these conditions: Kidney or liver disease An unusual or allergic reaction to buspirone, other medications, foods, dyes, or preservatives Pregnant or trying to get pregnant Breast-feeding How should I use this medication? Take this medication by mouth with a glass of water. Follow the directions on the prescription label. You may take this medication with or without food. To ensure that this medication always works the same way for you, you should take it either always with or always without food. Take your doses at regular intervals. Do not take your medication more often than directed. Do not stop taking except on the advice of your care team. Talk to your care team about the use of this medication in children. Special care may be needed. Overdosage: If you think you have taken too much of this medicine contact a poison control center or emergency room at once. NOTE: This medicine is only for you. Do not share this medicine with others. What if I miss a dose? If you miss a dose, take it as soon as you can. If it is almost time for your next dose, take only that dose. Do not take double or extra doses. What may interact with this medication? Do not take this medication with any of the following: Linezolid MAOIs like Carbex, Eldepryl, Marplan, Nardil, and Parnate Methylene blue Procarbazine This medication may also interact with the following: Diazepam Digoxin Diltiazem Erythromycin Grapefruit juice Haloperidol Medications for mental  depression or mood problems Medications for seizures like carbamazepine, phenobarbital and phenytoin Nefazodone Other medications for anxiety Rifampin Ritonavir Some antifungal medications like itraconazole, ketoconazole, and voriconazole Verapamil Warfarin This list may not describe all possible interactions. Give your health care provider a list of all the medicines, herbs, non-prescription drugs, or dietary supplements you use. Also tell them if you smoke, drink alcohol, or use illegal drugs. Some items may interact with your medicine. What should I watch for while using this medication? Visit your care team for regular checks on your progress. It may take 1 to 2 weeks before your anxiety gets better. This medication may affect your coordination, reaction time, or judgment. Do not drive or operate machinery until you know how this medication affects you. Sit up or stand slowly to reduce the risk of dizzy or fainting spells. Drinking alcohol with this medication can increase the risk of these side effects. What side effects may I notice from receiving this medication? Side effects that you should report to your care team as soon as possible: Allergic reactions--skin rash, itching, hives, swelling of the face, lips, tongue, or throat Irritability, confusion, fast or irregular heartbeat, muscle stiffness, twitching muscles, sweating, high fever, seizure, chills, vomiting, diarrhea, which may be signs of serotonin syndrome Side effects that usually do not require medical attention (report to your care team if they continue or are bothersome): Anxiety, nervousness Dizziness Drowsiness Headache Nausea Trouble sleeping This list may not describe all possible side effects. Call your doctor for medical advice about side effects. You may report side effects to FDA at 1-800-FDA-1088. Where should I keep   my medication? Keep out of the reach of children. Store at room temperature below 30 degrees C  (86 degrees F). Protect from light. Keep container tightly closed. Throw away any unused medication after the expiration date. NOTE: This sheet is a summary. It may not cover all possible information. If you have questions about this medicine, talk to your doctor, pharmacist, or health care provider.  2024 Elsevier/Gold Standard (2022-05-31 00:00:00)  

## 2023-09-29 NOTE — Progress Notes (Signed)
Follow Up  Assessment and Plan:  Paula Garcia was seen today for annual exam.  Diagnoses and all orders for this visit:  Obesity (BMI 30.0-34.9) Trending down Continue Phentermine Discussed appropriate BMI Diet modification. Physical activity. Encouraged/praised to build confidence.  Irritability/overstimulated Increase Buspar to 10 mg BID Discussed perimenopausal contributory symptoms.  Reviewed relaxation techniques.  Sleep hygiene. Recommended mindfulness meditation and exercise.   Encouraged personality growth wand development through coping techniques and problem-solving skills. Limit/Decrease/Monitor drug/alcohol intake.    Medication management All medications discussed and reviewed in full. All questions and concerns regarding medications addressed.    Flu vaccine need Administered - patient tolerated well  Orders Placed This Encounter  Procedures   Fluzone Trivalent Flu Vaccine (Muli dose preparattion)   Notify office for further evaluation and treatment, questions or concerns if any reported s/s fail to improve.   The patient was advised to call back or seek an in-person evaluation if any symptoms worsen or if the condition fails to improve as anticipated.   Further disposition pending results of labs. Discussed med's effects and SE's.    I discussed the assessment and treatment plan with the patient. The patient was provided an opportunity to ask questions and all were answered. The patient agreed with the plan and demonstrated an understanding of the instructions.  Discussed med's effects and SE's. Screening labs and tests as requested with regular follow-up as recommended.  I provided 30 minutes of face-to-face time during this encounter including counseling, chart review, and critical decision making was preformed.  Today's Plan of Care is based on a patient-centered health care approach known as shared decision making - the decisions, tests and treatments  allow for patient preferences and values to be balanced with clinical evidence.      Future Appointments  Date Time Provider Department Center  06/27/2024  3:00 PM Paula Glimpse, NP [facility] None    HPI  47 y.o. Caucasian female  presents for medication management follow up. She has Environmental allergies; DDD (degenerative disc disease), lumbar; Obesity (BMI 30.0-34.9); History of dysplastic nevus; Vitamin D deficiency; Melasma; B12 deficiency; and S/P laparoscopic hysterectomy on their problem list.    She has been concerned for irritability and slow to anger feeling overstimulated.  Reports sleeping well.  Feels this may have been of concern over the last several years but has failed to address.  Denies obsessive thoughts or crying.  Does note increase overall fatigue.  She has tried Buspar in the past without effectiveness but recently started back to 5 mg TID.  She forgets to take the middle of the day dose and has not seen much improvement on the current dose.  Was on Gabapentin as well, no longer taking. She also tried Wellbutrin but the SE were intolerable.    Sees OBGYN Dr. Jackelyn Knife annually.  Had partial hysterectomy 2024.  Still has ovaries.  Does not know if mood has changed since hysterectomy.  Possible perimenopausal symptoms given age.   BMI is Body mass index is 33.04 kg/m., she has been working on diet and exercise. She exercises 4-5 days a week tries to eat a well balanced diet.  Started Phentermine with overall effectiveness. Wt Readings from Last 3 Encounters:  09/29/23 201 lb 9.6 oz (91.4 kg)  09/05/23 206 lb (93.4 kg)  06/27/23 203 lb (92.1 kg)   Today their BP is BP: 120/70  She does workout. She denies chest pain, shortness of breath, dizziness.    Current Medications:  Current Outpatient Medications  on File Prior to Visit  Medication Sig Dispense Refill   busPIRone (BUSPAR) 5 MG tablet TAKE 1 TABLET(5 MG) BY MOUTH THREE TIMES DAILY 90 tablet 3    cetirizine (ZYRTEC) 10 MG tablet Take 10 mg by mouth daily as needed for allergies.     Cholecalciferol (VITAMIN D3 PO) Take 5,000 Units by mouth daily at 6 (six) AM.     ibuprofen (ADVIL) 600 MG tablet Take 1 tablet (600 mg total) by mouth every 6 (six) hours. 30 tablet 0   LYSINE PO Take by mouth.     meloxicam (MOBIC) 15 MG tablet Take 15 mg by mouth daily.     Multiple Vitamin (MULTIVITAMIN) tablet Take 1 tablet by mouth daily.     Omega-3 Fatty Acids (FISH OIL PO) Take 1 tablet by mouth daily at 6 (six) AM.     phentermine (ADIPEX-P) 37.5 MG tablet Take 1/2 to 1 tablet every Morning for Dieting & Weight Loss 30 tablet 0   Probiotic Product (PROBIOTIC BLEND PO) Take by mouth.     TURMERIC CURCUMIN PO Take 1 tablet by mouth daily at 6 (six) AM.     valACYclovir (VALTREX) 1000 MG tablet Take 4,000 mg by mouth daily as needed.     Zinc 50 MG TABS Take 1 tablet by mouth daily at 6 (six) AM.     b complex vitamins capsule Take 1 capsule by mouth daily. (Patient not taking: Reported on 09/29/2023)     buPROPion ER (WELLBUTRIN SR) 100 MG 12 hr tablet Take 1 tablet (100 mg total) by mouth daily. (Patient not taking: Reported on 09/29/2023) 30 tablet 2   gabapentin (NEURONTIN) 300 MG capsule Take 1 capsule (300 mg total) by mouth 3 (three) times daily. (Patient not taking: Reported on 09/29/2023) 7 capsule 0   No current facility-administered medications on file prior to visit.   Allergies:  Not on File Medical History:  She has Environmental allergies; DDD (degenerative disc disease), lumbar; Obesity (BMI 30.0-34.9); History of dysplastic nevus; Vitamin D deficiency; Melasma; B12 deficiency; and S/P laparoscopic hysterectomy on their problem list. Health Maintenance:   Immunization History  Administered Date(s) Administered   Fluzone Influenza virus vaccine,trivalent (IIV3), split virus 09/29/2023   Influenza Inj Mdck Quad With Preservative 09/06/2018, 08/21/2019   Influenza,inj,Quad PF,6+ Mos  10/07/2021   PFIZER(Purple Top)SARS-COV-2 Vaccination 02/15/2020, 03/11/2020   Tdap 10/07/2011, 06/23/2022   Surgical History:  She has a past surgical history that includes Cervical fusion (2012); Colposcopy; Cesarean section (2010); Wisdom tooth extraction (1995); Laparoscopic abdominal exploration (6045,4098); Colonoscopy (09/14/2022); Total laparoscopic hysterectomy with salpingectomy (Bilateral, 11/11/2022); and Cystoscopy (N/A, 11/11/2022). Family History:  Herfamily history includes Brain cancer in her paternal grandmother; COPD in her father; Cancer in her mother; Congenital Murmur in her son; Heart disease in her father; Stroke in her maternal grandfather; Throat cancer in her maternal aunt. Social History:  She reports that she has never smoked. She has never used smokeless tobacco. She reports current alcohol use of about 5.0 standard drinks of alcohol per week. She reports that she does not use drugs.  Review of Systems: Review of Systems  Constitutional:  Negative for chills, fever, malaise/fatigue and weight loss.  HENT:  Negative for congestion, hearing loss, sinus pain, sore throat and tinnitus.   Eyes:  Negative for blurred vision and double vision.  Respiratory:  Negative for cough, hemoptysis, sputum production, shortness of breath and wheezing.   Cardiovascular:  Negative for chest pain, palpitations, orthopnea, claudication and leg  swelling.  Gastrointestinal:  Negative for abdominal pain, blood in stool, constipation, diarrhea, heartburn, melena, nausea and vomiting.  Genitourinary: Negative.  Negative for dysuria and urgency.  Musculoskeletal:  Negative for back pain, falls, joint pain, myalgias and neck pain.  Skin:  Negative for rash.  Neurological:  Negative for dizziness, tingling, tremors, sensory change, weakness and headaches.  Endo/Heme/Allergies:  Negative for polydipsia. Does not bruise/bleed easily.  Psychiatric/Behavioral: Negative.  Negative for depression  and suicidal ideas. The patient is not nervous/anxious and does not have insomnia.   All other systems reviewed and are negative.   Physical Exam: Estimated body mass index is 33.04 kg/m as calculated from the following:   Height as of this encounter: 5' 5.5" (1.664 m).   Weight as of this encounter: 201 lb 9.6 oz (91.4 kg). BP 120/70   Pulse 80   Temp 97.6 F (36.4 C)   Ht 5' 5.5" (1.664 m)   Wt 201 lb 9.6 oz (91.4 kg)   SpO2 98%   BMI 33.04 kg/m  General Appearance: Well nourished, in no apparent distress.  Eyes: PERRLA, EOMs, conjunctiva no swelling or erythema Sinuses: No Frontal/maxillary tenderness  ENT/Mouth: Ext aud canals clear, normal light reflex with TMs without erythema, bulging. Good dentition. No erythema, swelling, or exudate on post pharynx. Tonsils not swollen or erythematous. Hearing normal.  Neck: Supple, thyroid normal. No bruits  Respiratory: Respiratory effort normal, BS equal bilaterally without rales, rhonchi, wheezing or stridor.  Cardio: RRR without murmurs, rubs or gallops. Brisk peripheral pulses without edema.  Chest: symmetric, with normal excursions and percussion.  Breasts: Defer to GYN  Abdomen: Soft, nontender, no guarding, rebound, hernias, masses, or organomegaly.  Lymphatics: Non tender without lymphadenopathy.  Genitourinary: Defer to GYN Musculoskeletal: Full ROM all peripheral extremities,5/5 strength, and normal gait.  Skin: Warm, dry without rashes, ecchymosis. She has numerous small nevi, flat, <6 mm; two to back with irregular borders, unchanged from last year. Flat brown discoloration approx 2 cm area to left cheek.  Neuro: Cranial nerves intact, reflexes equal bilaterally. Normal muscle tone, no cerebellar symptoms. Sensation intact.  Psych: Awake and oriented X 3, normal affect, Insight and Judgment appropriate.    Taysen Bushart 12:07 PM [facility]

## 2023-10-03 ENCOUNTER — Other Ambulatory Visit: Payer: Self-pay | Admitting: Nurse Practitioner

## 2024-01-12 ENCOUNTER — Other Ambulatory Visit: Payer: Self-pay

## 2024-01-12 MED ORDER — PHENTERMINE HCL 37.5 MG PO TABS: ORAL_TABLET | ORAL | 0 refills | Status: AC

## 2024-01-17 ENCOUNTER — Other Ambulatory Visit: Payer: Self-pay

## 2024-01-17 DIAGNOSIS — F419 Anxiety disorder, unspecified: Secondary | ICD-10-CM

## 2024-01-17 MED ORDER — BUSPIRONE HCL 5 MG PO TABS: 5.0000 mg | ORAL_TABLET | Freq: Three times a day (TID) | ORAL | 3 refills | Status: AC

## 2024-06-27 ENCOUNTER — Encounter: Payer: 59 | Admitting: Nurse Practitioner
